# Patient Record
Sex: Male | Born: 1965 | Race: White | Hispanic: No | Marital: Married | Smoking: Former smoker
Health system: Southern US, Community
[De-identification: ages and names within clinical notes are randomized; demographics above are authoritative.]

## PROBLEM LIST (undated history)

## (undated) DIAGNOSIS — E669 Obesity, unspecified: Secondary | ICD-10-CM

## (undated) DIAGNOSIS — J449 Chronic obstructive pulmonary disease, unspecified: Secondary | ICD-10-CM

## (undated) DIAGNOSIS — E119 Type 2 diabetes mellitus without complications: Secondary | ICD-10-CM

## (undated) DIAGNOSIS — R296 Repeated falls: Secondary | ICD-10-CM

## (undated) DIAGNOSIS — F985 Adult onset fluency disorder: Secondary | ICD-10-CM

## (undated) HISTORY — PX: PARTIAL COLECTOMY: SHX5273

## (undated) HISTORY — PX: APPENDECTOMY: SHX54

## (undated) HISTORY — DX: Adult onset fluency disorder: F98.5

## (undated) HISTORY — DX: Repeated falls: R29.6

## (undated) HISTORY — PX: ANKLE FUSION: SHX881

---

## 2006-05-24 ENCOUNTER — Ambulatory Visit: Payer: Self-pay | Admitting: Gastroenterology

## 2013-05-03 ENCOUNTER — Other Ambulatory Visit (HOSPITAL_COMMUNITY): Payer: Self-pay | Admitting: Internal Medicine

## 2013-05-03 DIAGNOSIS — R918 Other nonspecific abnormal finding of lung field: Secondary | ICD-10-CM

## 2013-06-25 ENCOUNTER — Ambulatory Visit (HOSPITAL_COMMUNITY): Payer: Managed Care, Other (non HMO)

## 2014-10-17 ENCOUNTER — Encounter (HOSPITAL_COMMUNITY): Payer: Self-pay | Admitting: General Practice

## 2014-10-17 ENCOUNTER — Emergency Department (HOSPITAL_COMMUNITY)
Admission: EM | Admit: 2014-10-17 | Discharge: 2014-10-17 | Disposition: A | Discharge status: Home / Self Care | Payer: Managed Care, Other (non HMO) | Attending: Emergency Medicine | Admitting: Emergency Medicine

## 2014-10-17 ENCOUNTER — Emergency Department (HOSPITAL_COMMUNITY): Payer: Managed Care, Other (non HMO)

## 2014-10-17 DIAGNOSIS — Z87828 Personal history of other (healed) physical injury and trauma: Secondary | ICD-10-CM | POA: Diagnosis not present

## 2014-10-17 DIAGNOSIS — Z79899 Other long term (current) drug therapy: Secondary | ICD-10-CM | POA: Insufficient documentation

## 2014-10-17 DIAGNOSIS — S3992XA Unspecified injury of lower back, initial encounter: Secondary | ICD-10-CM | POA: Insufficient documentation

## 2014-10-17 DIAGNOSIS — Y999 Unspecified external cause status: Secondary | ICD-10-CM | POA: Insufficient documentation

## 2014-10-17 DIAGNOSIS — E119 Type 2 diabetes mellitus without complications: Secondary | ICD-10-CM | POA: Insufficient documentation

## 2014-10-17 DIAGNOSIS — E669 Obesity, unspecified: Secondary | ICD-10-CM | POA: Insufficient documentation

## 2014-10-17 DIAGNOSIS — S199XXA Unspecified injury of neck, initial encounter: Secondary | ICD-10-CM | POA: Diagnosis not present

## 2014-10-17 DIAGNOSIS — Y92410 Unspecified street and highway as the place of occurrence of the external cause: Secondary | ICD-10-CM | POA: Diagnosis not present

## 2014-10-17 DIAGNOSIS — M545 Low back pain, unspecified: Secondary | ICD-10-CM

## 2014-10-17 DIAGNOSIS — R202 Paresthesia of skin: Secondary | ICD-10-CM | POA: Insufficient documentation

## 2014-10-17 DIAGNOSIS — Z791 Long term (current) use of non-steroidal anti-inflammatories (NSAID): Secondary | ICD-10-CM | POA: Diagnosis not present

## 2014-10-17 DIAGNOSIS — Z720 Tobacco use: Secondary | ICD-10-CM | POA: Insufficient documentation

## 2014-10-17 DIAGNOSIS — Y9389 Activity, other specified: Secondary | ICD-10-CM | POA: Diagnosis not present

## 2014-10-17 DIAGNOSIS — M542 Cervicalgia: Secondary | ICD-10-CM

## 2014-10-17 HISTORY — DX: Type 2 diabetes mellitus without complications: E11.9

## 2014-10-17 HISTORY — DX: Obesity, unspecified: E66.9

## 2014-10-17 LAB — CBG MONITORING, ED: GLUCOSE-CAPILLARY: 278 mg/dL — AB (ref 65–99)

## 2014-10-17 MED ORDER — HYDROCODONE-ACETAMINOPHEN 5-325 MG PO TABS
2.0000 | ORAL_TABLET | Freq: Once | ORAL | Status: AC
  Administered 2014-10-17: 2 via ORAL
  Filled 2014-10-17: qty 2

## 2014-10-17 MED ORDER — HYDROMORPHONE HCL 1 MG/ML IJ SOLN: 1.0000 mg | Freq: Once | INTRAMUSCULAR | Status: DC

## 2014-10-17 MED ORDER — HYDROMORPHONE HCL 1 MG/ML IJ SOLN
1.0000 mg | Freq: Once | INTRAMUSCULAR | Status: AC
  Administered 2014-10-17: 1 mg via INTRAMUSCULAR
  Filled 2014-10-17: qty 1

## 2014-10-17 MED ORDER — CYCLOBENZAPRINE HCL 10 MG PO TABS: 10.0000 mg | ORAL_TABLET | Freq: Two times a day (BID) | ORAL | Status: AC | PRN

## 2014-10-17 MED ORDER — HYDROCODONE-ACETAMINOPHEN 5-325 MG PO TABS: 1.0000 | ORAL_TABLET | Freq: Four times a day (QID) | ORAL | Status: DC | PRN

## 2014-10-17 NOTE — ED Notes (Signed)
Pt off unit with CT 
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
## 2014-10-17 NOTE — ED Notes (Signed)
Pt off unit with MRI 
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
## 2014-10-17 NOTE — Discharge Instructions (Signed)
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
## 2014-10-17 NOTE — ED Notes (Signed)
Pt cbg 278

## 2014-10-17 NOTE — ED Notes (Signed)
Pt returned from CT °
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
## 2018-09-13 DIAGNOSIS — E782 Mixed hyperlipidemia: Secondary | ICD-10-CM | POA: Diagnosis not present

## 2018-09-13 DIAGNOSIS — G4700 Insomnia, unspecified: Secondary | ICD-10-CM | POA: Diagnosis not present

## 2018-09-13 DIAGNOSIS — G894 Chronic pain syndrome: Secondary | ICD-10-CM | POA: Diagnosis not present

## 2018-09-13 DIAGNOSIS — I10 Essential (primary) hypertension: Secondary | ICD-10-CM | POA: Diagnosis not present

## 2018-09-13 DIAGNOSIS — E1165 Type 2 diabetes mellitus with hyperglycemia: Secondary | ICD-10-CM | POA: Diagnosis not present

## 2018-10-19 DIAGNOSIS — M47812 Spondylosis without myelopathy or radiculopathy, cervical region: Secondary | ICD-10-CM | POA: Diagnosis not present

## 2018-10-23 DIAGNOSIS — G894 Chronic pain syndrome: Secondary | ICD-10-CM | POA: Diagnosis not present

## 2018-10-23 DIAGNOSIS — I10 Essential (primary) hypertension: Secondary | ICD-10-CM | POA: Diagnosis not present

## 2018-10-23 DIAGNOSIS — E1165 Type 2 diabetes mellitus with hyperglycemia: Secondary | ICD-10-CM | POA: Diagnosis not present

## 2018-10-24 DIAGNOSIS — E1165 Type 2 diabetes mellitus with hyperglycemia: Secondary | ICD-10-CM | POA: Diagnosis not present

## 2018-10-24 DIAGNOSIS — G4700 Insomnia, unspecified: Secondary | ICD-10-CM | POA: Diagnosis not present

## 2018-10-24 DIAGNOSIS — I10 Essential (primary) hypertension: Secondary | ICD-10-CM | POA: Diagnosis not present

## 2018-10-24 DIAGNOSIS — G894 Chronic pain syndrome: Secondary | ICD-10-CM | POA: Diagnosis not present

## 2018-10-24 DIAGNOSIS — E782 Mixed hyperlipidemia: Secondary | ICD-10-CM | POA: Diagnosis not present

## 2018-11-01 DIAGNOSIS — E782 Mixed hyperlipidemia: Secondary | ICD-10-CM | POA: Diagnosis not present

## 2018-11-01 DIAGNOSIS — I10 Essential (primary) hypertension: Secondary | ICD-10-CM | POA: Diagnosis not present

## 2018-11-01 DIAGNOSIS — E1165 Type 2 diabetes mellitus with hyperglycemia: Secondary | ICD-10-CM | POA: Diagnosis not present

## 2018-11-01 DIAGNOSIS — G894 Chronic pain syndrome: Secondary | ICD-10-CM | POA: Diagnosis not present

## 2018-11-01 DIAGNOSIS — G4700 Insomnia, unspecified: Secondary | ICD-10-CM | POA: Diagnosis not present

## 2018-11-15 DIAGNOSIS — M47812 Spondylosis without myelopathy or radiculopathy, cervical region: Secondary | ICD-10-CM | POA: Diagnosis not present

## 2018-12-13 DIAGNOSIS — I10 Essential (primary) hypertension: Secondary | ICD-10-CM | POA: Diagnosis not present

## 2018-12-13 DIAGNOSIS — E782 Mixed hyperlipidemia: Secondary | ICD-10-CM | POA: Diagnosis not present

## 2018-12-13 DIAGNOSIS — E1165 Type 2 diabetes mellitus with hyperglycemia: Secondary | ICD-10-CM | POA: Diagnosis not present

## 2018-12-13 DIAGNOSIS — G894 Chronic pain syndrome: Secondary | ICD-10-CM | POA: Diagnosis not present

## 2018-12-13 DIAGNOSIS — G4700 Insomnia, unspecified: Secondary | ICD-10-CM | POA: Diagnosis not present

## 2019-01-03 DIAGNOSIS — Z79899 Other long term (current) drug therapy: Secondary | ICD-10-CM | POA: Diagnosis not present

## 2019-01-03 DIAGNOSIS — Z7984 Long term (current) use of oral hypoglycemic drugs: Secondary | ICD-10-CM | POA: Diagnosis not present

## 2019-01-03 DIAGNOSIS — E782 Mixed hyperlipidemia: Secondary | ICD-10-CM | POA: Diagnosis not present

## 2019-01-03 DIAGNOSIS — J449 Chronic obstructive pulmonary disease, unspecified: Secondary | ICD-10-CM | POA: Diagnosis not present

## 2019-01-03 DIAGNOSIS — E1165 Type 2 diabetes mellitus with hyperglycemia: Secondary | ICD-10-CM | POA: Diagnosis not present

## 2019-01-03 DIAGNOSIS — M47812 Spondylosis without myelopathy or radiculopathy, cervical region: Secondary | ICD-10-CM | POA: Diagnosis not present

## 2019-01-03 DIAGNOSIS — I10 Essential (primary) hypertension: Secondary | ICD-10-CM | POA: Diagnosis not present

## 2019-01-03 DIAGNOSIS — G4700 Insomnia, unspecified: Secondary | ICD-10-CM | POA: Diagnosis not present

## 2019-01-03 DIAGNOSIS — E119 Type 2 diabetes mellitus without complications: Secondary | ICD-10-CM | POA: Diagnosis not present

## 2019-01-03 DIAGNOSIS — G894 Chronic pain syndrome: Secondary | ICD-10-CM | POA: Diagnosis not present

## 2019-01-03 DIAGNOSIS — Z87891 Personal history of nicotine dependence: Secondary | ICD-10-CM | POA: Diagnosis not present

## 2019-01-18 DIAGNOSIS — E1165 Type 2 diabetes mellitus with hyperglycemia: Secondary | ICD-10-CM | POA: Diagnosis not present

## 2019-01-18 DIAGNOSIS — E782 Mixed hyperlipidemia: Secondary | ICD-10-CM | POA: Diagnosis not present

## 2019-01-18 DIAGNOSIS — E1140 Type 2 diabetes mellitus with diabetic neuropathy, unspecified: Secondary | ICD-10-CM | POA: Diagnosis not present

## 2019-01-18 DIAGNOSIS — I10 Essential (primary) hypertension: Secondary | ICD-10-CM | POA: Diagnosis not present

## 2019-01-23 DIAGNOSIS — I10 Essential (primary) hypertension: Secondary | ICD-10-CM | POA: Diagnosis not present

## 2019-01-23 DIAGNOSIS — E782 Mixed hyperlipidemia: Secondary | ICD-10-CM | POA: Diagnosis not present

## 2019-01-23 DIAGNOSIS — E1165 Type 2 diabetes mellitus with hyperglycemia: Secondary | ICD-10-CM | POA: Diagnosis not present

## 2019-01-23 DIAGNOSIS — F0781 Postconcussional syndrome: Secondary | ICD-10-CM | POA: Diagnosis not present

## 2019-01-23 DIAGNOSIS — G894 Chronic pain syndrome: Secondary | ICD-10-CM | POA: Diagnosis not present

## 2019-01-23 DIAGNOSIS — M5481 Occipital neuralgia: Secondary | ICD-10-CM | POA: Diagnosis not present

## 2019-01-23 DIAGNOSIS — G4700 Insomnia, unspecified: Secondary | ICD-10-CM | POA: Diagnosis not present

## 2019-01-23 DIAGNOSIS — Z6837 Body mass index (BMI) 37.0-37.9, adult: Secondary | ICD-10-CM | POA: Diagnosis not present

## 2019-01-23 DIAGNOSIS — M5412 Radiculopathy, cervical region: Secondary | ICD-10-CM | POA: Diagnosis not present

## 2019-02-01 DIAGNOSIS — G8929 Other chronic pain: Secondary | ICD-10-CM | POA: Diagnosis not present

## 2019-02-01 DIAGNOSIS — G4700 Insomnia, unspecified: Secondary | ICD-10-CM | POA: Diagnosis not present

## 2019-02-01 DIAGNOSIS — E782 Mixed hyperlipidemia: Secondary | ICD-10-CM | POA: Diagnosis not present

## 2019-02-01 DIAGNOSIS — M5412 Radiculopathy, cervical region: Secondary | ICD-10-CM | POA: Diagnosis not present

## 2019-02-01 DIAGNOSIS — E1140 Type 2 diabetes mellitus with diabetic neuropathy, unspecified: Secondary | ICD-10-CM | POA: Diagnosis not present

## 2019-02-01 DIAGNOSIS — I10 Essential (primary) hypertension: Secondary | ICD-10-CM | POA: Diagnosis not present

## 2019-02-01 DIAGNOSIS — Z5181 Encounter for therapeutic drug level monitoring: Secondary | ICD-10-CM | POA: Diagnosis not present

## 2019-04-02 DIAGNOSIS — G894 Chronic pain syndrome: Secondary | ICD-10-CM | POA: Diagnosis not present

## 2019-04-02 DIAGNOSIS — G4700 Insomnia, unspecified: Secondary | ICD-10-CM | POA: Diagnosis not present

## 2019-04-02 DIAGNOSIS — I10 Essential (primary) hypertension: Secondary | ICD-10-CM | POA: Diagnosis not present

## 2019-04-02 DIAGNOSIS — E782 Mixed hyperlipidemia: Secondary | ICD-10-CM | POA: Diagnosis not present

## 2019-04-02 DIAGNOSIS — E1165 Type 2 diabetes mellitus with hyperglycemia: Secondary | ICD-10-CM | POA: Diagnosis not present

## 2019-05-07 DIAGNOSIS — G894 Chronic pain syndrome: Secondary | ICD-10-CM | POA: Diagnosis not present

## 2019-05-07 DIAGNOSIS — E1165 Type 2 diabetes mellitus with hyperglycemia: Secondary | ICD-10-CM | POA: Diagnosis not present

## 2019-05-07 DIAGNOSIS — E782 Mixed hyperlipidemia: Secondary | ICD-10-CM | POA: Diagnosis not present

## 2019-05-07 DIAGNOSIS — G4700 Insomnia, unspecified: Secondary | ICD-10-CM | POA: Diagnosis not present

## 2019-05-07 DIAGNOSIS — I10 Essential (primary) hypertension: Secondary | ICD-10-CM | POA: Diagnosis not present

## 2019-05-23 DIAGNOSIS — G8929 Other chronic pain: Secondary | ICD-10-CM | POA: Diagnosis not present

## 2019-05-23 DIAGNOSIS — M5412 Radiculopathy, cervical region: Secondary | ICD-10-CM | POA: Diagnosis not present

## 2019-05-23 DIAGNOSIS — Z0001 Encounter for general adult medical examination with abnormal findings: Secondary | ICD-10-CM | POA: Diagnosis not present

## 2019-05-23 DIAGNOSIS — E782 Mixed hyperlipidemia: Secondary | ICD-10-CM | POA: Diagnosis not present

## 2019-05-23 DIAGNOSIS — I10 Essential (primary) hypertension: Secondary | ICD-10-CM | POA: Diagnosis not present

## 2019-05-23 DIAGNOSIS — Z5181 Encounter for therapeutic drug level monitoring: Secondary | ICD-10-CM | POA: Diagnosis not present

## 2019-05-23 DIAGNOSIS — E1140 Type 2 diabetes mellitus with diabetic neuropathy, unspecified: Secondary | ICD-10-CM | POA: Diagnosis not present

## 2019-05-23 DIAGNOSIS — G4700 Insomnia, unspecified: Secondary | ICD-10-CM | POA: Diagnosis not present

## 2019-05-28 DIAGNOSIS — G894 Chronic pain syndrome: Secondary | ICD-10-CM | POA: Diagnosis not present

## 2019-05-28 DIAGNOSIS — M5412 Radiculopathy, cervical region: Secondary | ICD-10-CM | POA: Diagnosis not present

## 2019-05-28 DIAGNOSIS — G4700 Insomnia, unspecified: Secondary | ICD-10-CM | POA: Diagnosis not present

## 2019-05-28 DIAGNOSIS — E1165 Type 2 diabetes mellitus with hyperglycemia: Secondary | ICD-10-CM | POA: Diagnosis not present

## 2019-05-28 DIAGNOSIS — E782 Mixed hyperlipidemia: Secondary | ICD-10-CM | POA: Diagnosis not present

## 2019-05-28 DIAGNOSIS — F0781 Postconcussional syndrome: Secondary | ICD-10-CM | POA: Diagnosis not present

## 2019-05-28 DIAGNOSIS — M5481 Occipital neuralgia: Secondary | ICD-10-CM | POA: Diagnosis not present

## 2019-05-28 DIAGNOSIS — I10 Essential (primary) hypertension: Secondary | ICD-10-CM | POA: Diagnosis not present

## 2019-06-04 DIAGNOSIS — I10 Essential (primary) hypertension: Secondary | ICD-10-CM | POA: Diagnosis not present

## 2019-06-04 DIAGNOSIS — E1165 Type 2 diabetes mellitus with hyperglycemia: Secondary | ICD-10-CM | POA: Diagnosis not present

## 2019-06-04 DIAGNOSIS — G894 Chronic pain syndrome: Secondary | ICD-10-CM | POA: Diagnosis not present

## 2019-06-04 DIAGNOSIS — E782 Mixed hyperlipidemia: Secondary | ICD-10-CM | POA: Diagnosis not present

## 2019-06-04 DIAGNOSIS — G4700 Insomnia, unspecified: Secondary | ICD-10-CM | POA: Diagnosis not present

## 2019-07-06 DIAGNOSIS — G4700 Insomnia, unspecified: Secondary | ICD-10-CM | POA: Diagnosis not present

## 2019-07-06 DIAGNOSIS — G894 Chronic pain syndrome: Secondary | ICD-10-CM | POA: Diagnosis not present

## 2019-07-06 DIAGNOSIS — I10 Essential (primary) hypertension: Secondary | ICD-10-CM | POA: Diagnosis not present

## 2019-07-06 DIAGNOSIS — E1165 Type 2 diabetes mellitus with hyperglycemia: Secondary | ICD-10-CM | POA: Diagnosis not present

## 2019-07-06 DIAGNOSIS — E782 Mixed hyperlipidemia: Secondary | ICD-10-CM | POA: Diagnosis not present

## 2019-07-16 DIAGNOSIS — M5432 Sciatica, left side: Secondary | ICD-10-CM | POA: Diagnosis not present

## 2019-07-30 DIAGNOSIS — E782 Mixed hyperlipidemia: Secondary | ICD-10-CM | POA: Diagnosis not present

## 2019-07-30 DIAGNOSIS — G894 Chronic pain syndrome: Secondary | ICD-10-CM | POA: Diagnosis not present

## 2019-07-30 DIAGNOSIS — I10 Essential (primary) hypertension: Secondary | ICD-10-CM | POA: Diagnosis not present

## 2019-07-30 DIAGNOSIS — E1165 Type 2 diabetes mellitus with hyperglycemia: Secondary | ICD-10-CM | POA: Diagnosis not present

## 2019-07-30 DIAGNOSIS — G4700 Insomnia, unspecified: Secondary | ICD-10-CM | POA: Diagnosis not present

## 2019-08-29 DIAGNOSIS — G894 Chronic pain syndrome: Secondary | ICD-10-CM | POA: Diagnosis not present

## 2019-08-29 DIAGNOSIS — G4700 Insomnia, unspecified: Secondary | ICD-10-CM | POA: Diagnosis not present

## 2019-08-29 DIAGNOSIS — E782 Mixed hyperlipidemia: Secondary | ICD-10-CM | POA: Diagnosis not present

## 2019-08-29 DIAGNOSIS — E1165 Type 2 diabetes mellitus with hyperglycemia: Secondary | ICD-10-CM | POA: Diagnosis not present

## 2019-08-29 DIAGNOSIS — I10 Essential (primary) hypertension: Secondary | ICD-10-CM | POA: Diagnosis not present

## 2019-10-26 DIAGNOSIS — G894 Chronic pain syndrome: Secondary | ICD-10-CM | POA: Diagnosis not present

## 2019-10-26 DIAGNOSIS — E782 Mixed hyperlipidemia: Secondary | ICD-10-CM | POA: Diagnosis not present

## 2019-10-26 DIAGNOSIS — I10 Essential (primary) hypertension: Secondary | ICD-10-CM | POA: Diagnosis not present

## 2019-10-26 DIAGNOSIS — E1165 Type 2 diabetes mellitus with hyperglycemia: Secondary | ICD-10-CM | POA: Diagnosis not present

## 2019-10-26 DIAGNOSIS — G4700 Insomnia, unspecified: Secondary | ICD-10-CM | POA: Diagnosis not present

## 2019-10-29 DIAGNOSIS — E1165 Type 2 diabetes mellitus with hyperglycemia: Secondary | ICD-10-CM | POA: Diagnosis not present

## 2019-10-29 DIAGNOSIS — E7849 Other hyperlipidemia: Secondary | ICD-10-CM | POA: Diagnosis not present

## 2019-10-29 DIAGNOSIS — G894 Chronic pain syndrome: Secondary | ICD-10-CM | POA: Diagnosis not present

## 2019-10-29 DIAGNOSIS — I10 Essential (primary) hypertension: Secondary | ICD-10-CM | POA: Diagnosis not present

## 2019-11-29 DIAGNOSIS — E1140 Type 2 diabetes mellitus with diabetic neuropathy, unspecified: Secondary | ICD-10-CM | POA: Diagnosis not present

## 2019-11-29 DIAGNOSIS — Z5181 Encounter for therapeutic drug level monitoring: Secondary | ICD-10-CM | POA: Diagnosis not present

## 2019-11-29 DIAGNOSIS — G8929 Other chronic pain: Secondary | ICD-10-CM | POA: Diagnosis not present

## 2019-11-29 DIAGNOSIS — I10 Essential (primary) hypertension: Secondary | ICD-10-CM | POA: Diagnosis not present

## 2019-11-29 DIAGNOSIS — E782 Mixed hyperlipidemia: Secondary | ICD-10-CM | POA: Diagnosis not present

## 2019-11-29 DIAGNOSIS — Z0001 Encounter for general adult medical examination with abnormal findings: Secondary | ICD-10-CM | POA: Diagnosis not present

## 2019-11-29 DIAGNOSIS — M5412 Radiculopathy, cervical region: Secondary | ICD-10-CM | POA: Diagnosis not present

## 2019-11-29 DIAGNOSIS — G4700 Insomnia, unspecified: Secondary | ICD-10-CM | POA: Diagnosis not present

## 2019-12-05 DIAGNOSIS — M5412 Radiculopathy, cervical region: Secondary | ICD-10-CM | POA: Diagnosis not present

## 2019-12-05 DIAGNOSIS — M5481 Occipital neuralgia: Secondary | ICD-10-CM | POA: Diagnosis not present

## 2019-12-05 DIAGNOSIS — Z0001 Encounter for general adult medical examination with abnormal findings: Secondary | ICD-10-CM | POA: Diagnosis not present

## 2019-12-05 DIAGNOSIS — E1165 Type 2 diabetes mellitus with hyperglycemia: Secondary | ICD-10-CM | POA: Diagnosis not present

## 2019-12-05 DIAGNOSIS — G894 Chronic pain syndrome: Secondary | ICD-10-CM | POA: Diagnosis not present

## 2019-12-05 DIAGNOSIS — E782 Mixed hyperlipidemia: Secondary | ICD-10-CM | POA: Diagnosis not present

## 2019-12-05 DIAGNOSIS — G4700 Insomnia, unspecified: Secondary | ICD-10-CM | POA: Diagnosis not present

## 2019-12-05 DIAGNOSIS — I10 Essential (primary) hypertension: Secondary | ICD-10-CM | POA: Diagnosis not present

## 2019-12-05 DIAGNOSIS — F0781 Postconcussional syndrome: Secondary | ICD-10-CM | POA: Diagnosis not present

## 2020-01-01 DIAGNOSIS — E1165 Type 2 diabetes mellitus with hyperglycemia: Secondary | ICD-10-CM | POA: Diagnosis not present

## 2020-01-01 DIAGNOSIS — I10 Essential (primary) hypertension: Secondary | ICD-10-CM | POA: Diagnosis not present

## 2020-01-01 DIAGNOSIS — G4700 Insomnia, unspecified: Secondary | ICD-10-CM | POA: Diagnosis not present

## 2020-01-01 DIAGNOSIS — E782 Mixed hyperlipidemia: Secondary | ICD-10-CM | POA: Diagnosis not present

## 2020-01-01 DIAGNOSIS — G894 Chronic pain syndrome: Secondary | ICD-10-CM | POA: Diagnosis not present

## 2020-02-04 DIAGNOSIS — E782 Mixed hyperlipidemia: Secondary | ICD-10-CM | POA: Diagnosis not present

## 2020-02-04 DIAGNOSIS — G4700 Insomnia, unspecified: Secondary | ICD-10-CM | POA: Diagnosis not present

## 2020-02-04 DIAGNOSIS — I10 Essential (primary) hypertension: Secondary | ICD-10-CM | POA: Diagnosis not present

## 2020-02-04 DIAGNOSIS — E1165 Type 2 diabetes mellitus with hyperglycemia: Secondary | ICD-10-CM | POA: Diagnosis not present

## 2020-02-04 DIAGNOSIS — G894 Chronic pain syndrome: Secondary | ICD-10-CM | POA: Diagnosis not present

## 2020-02-14 DIAGNOSIS — K432 Incisional hernia without obstruction or gangrene: Secondary | ICD-10-CM | POA: Diagnosis not present

## 2020-02-14 DIAGNOSIS — Z6841 Body Mass Index (BMI) 40.0 and over, adult: Secondary | ICD-10-CM | POA: Diagnosis not present

## 2020-02-14 DIAGNOSIS — E119 Type 2 diabetes mellitus without complications: Secondary | ICD-10-CM | POA: Diagnosis not present

## 2020-03-20 DIAGNOSIS — E119 Type 2 diabetes mellitus without complications: Secondary | ICD-10-CM | POA: Diagnosis not present

## 2020-03-28 DIAGNOSIS — G894 Chronic pain syndrome: Secondary | ICD-10-CM | POA: Diagnosis not present

## 2020-03-28 DIAGNOSIS — I10 Essential (primary) hypertension: Secondary | ICD-10-CM | POA: Diagnosis not present

## 2020-03-28 DIAGNOSIS — E1165 Type 2 diabetes mellitus with hyperglycemia: Secondary | ICD-10-CM | POA: Diagnosis not present

## 2020-03-28 DIAGNOSIS — E782 Mixed hyperlipidemia: Secondary | ICD-10-CM | POA: Diagnosis not present

## 2020-03-28 DIAGNOSIS — G4700 Insomnia, unspecified: Secondary | ICD-10-CM | POA: Diagnosis not present

## 2020-04-04 DIAGNOSIS — J069 Acute upper respiratory infection, unspecified: Secondary | ICD-10-CM | POA: Diagnosis not present

## 2020-04-26 DIAGNOSIS — G4700 Insomnia, unspecified: Secondary | ICD-10-CM | POA: Diagnosis not present

## 2020-04-26 DIAGNOSIS — E1165 Type 2 diabetes mellitus with hyperglycemia: Secondary | ICD-10-CM | POA: Diagnosis not present

## 2020-04-26 DIAGNOSIS — I10 Essential (primary) hypertension: Secondary | ICD-10-CM | POA: Diagnosis not present

## 2020-04-26 DIAGNOSIS — G894 Chronic pain syndrome: Secondary | ICD-10-CM | POA: Diagnosis not present

## 2020-04-26 DIAGNOSIS — E782 Mixed hyperlipidemia: Secondary | ICD-10-CM | POA: Diagnosis not present

## 2020-05-17 DIAGNOSIS — B370 Candidal stomatitis: Secondary | ICD-10-CM | POA: Diagnosis not present

## 2020-05-26 DIAGNOSIS — I10 Essential (primary) hypertension: Secondary | ICD-10-CM | POA: Diagnosis not present

## 2020-05-26 DIAGNOSIS — E1165 Type 2 diabetes mellitus with hyperglycemia: Secondary | ICD-10-CM | POA: Diagnosis not present

## 2020-06-17 DIAGNOSIS — E559 Vitamin D deficiency, unspecified: Secondary | ICD-10-CM | POA: Diagnosis not present

## 2020-06-17 DIAGNOSIS — Z6841 Body Mass Index (BMI) 40.0 and over, adult: Secondary | ICD-10-CM | POA: Diagnosis not present

## 2020-06-17 DIAGNOSIS — E1140 Type 2 diabetes mellitus with diabetic neuropathy, unspecified: Secondary | ICD-10-CM | POA: Diagnosis not present

## 2020-06-17 DIAGNOSIS — E7849 Other hyperlipidemia: Secondary | ICD-10-CM | POA: Diagnosis not present

## 2020-06-17 DIAGNOSIS — Z0001 Encounter for general adult medical examination with abnormal findings: Secondary | ICD-10-CM | POA: Diagnosis not present

## 2020-06-17 DIAGNOSIS — E1165 Type 2 diabetes mellitus with hyperglycemia: Secondary | ICD-10-CM | POA: Diagnosis not present

## 2020-06-17 DIAGNOSIS — I10 Essential (primary) hypertension: Secondary | ICD-10-CM | POA: Diagnosis not present

## 2020-06-17 DIAGNOSIS — E782 Mixed hyperlipidemia: Secondary | ICD-10-CM | POA: Diagnosis not present

## 2020-06-25 DIAGNOSIS — J069 Acute upper respiratory infection, unspecified: Secondary | ICD-10-CM | POA: Diagnosis not present

## 2020-06-25 DIAGNOSIS — I10 Essential (primary) hypertension: Secondary | ICD-10-CM | POA: Diagnosis not present

## 2020-06-25 DIAGNOSIS — E1165 Type 2 diabetes mellitus with hyperglycemia: Secondary | ICD-10-CM | POA: Diagnosis not present

## 2020-07-27 DIAGNOSIS — E1165 Type 2 diabetes mellitus with hyperglycemia: Secondary | ICD-10-CM | POA: Diagnosis not present

## 2020-07-27 DIAGNOSIS — I10 Essential (primary) hypertension: Secondary | ICD-10-CM | POA: Diagnosis not present

## 2020-08-26 DIAGNOSIS — E1165 Type 2 diabetes mellitus with hyperglycemia: Secondary | ICD-10-CM | POA: Diagnosis not present

## 2020-08-26 DIAGNOSIS — I10 Essential (primary) hypertension: Secondary | ICD-10-CM | POA: Diagnosis not present

## 2020-09-06 DIAGNOSIS — R410 Disorientation, unspecified: Secondary | ICD-10-CM | POA: Diagnosis not present

## 2020-09-06 DIAGNOSIS — R42 Dizziness and giddiness: Secondary | ICD-10-CM | POA: Diagnosis not present

## 2020-09-06 DIAGNOSIS — R059 Cough, unspecified: Secondary | ICD-10-CM | POA: Diagnosis not present

## 2020-09-06 DIAGNOSIS — R9431 Abnormal electrocardiogram [ECG] [EKG]: Secondary | ICD-10-CM | POA: Diagnosis not present

## 2020-09-06 DIAGNOSIS — R55 Syncope and collapse: Secondary | ICD-10-CM | POA: Diagnosis not present

## 2020-09-06 DIAGNOSIS — R4781 Slurred speech: Secondary | ICD-10-CM | POA: Diagnosis not present

## 2020-09-06 DIAGNOSIS — R4182 Altered mental status, unspecified: Secondary | ICD-10-CM | POA: Diagnosis not present

## 2020-09-06 DIAGNOSIS — E860 Dehydration: Secondary | ICD-10-CM | POA: Diagnosis not present

## 2020-09-06 DIAGNOSIS — E119 Type 2 diabetes mellitus without complications: Secondary | ICD-10-CM | POA: Diagnosis not present

## 2020-09-06 DIAGNOSIS — I517 Cardiomegaly: Secondary | ICD-10-CM | POA: Diagnosis not present

## 2020-09-06 DIAGNOSIS — J811 Chronic pulmonary edema: Secondary | ICD-10-CM | POA: Diagnosis not present

## 2020-09-09 ENCOUNTER — Other Ambulatory Visit (HOSPITAL_COMMUNITY): Payer: Self-pay | Admitting: Family Medicine

## 2020-09-09 DIAGNOSIS — F8081 Childhood onset fluency disorder: Secondary | ICD-10-CM | POA: Diagnosis not present

## 2020-09-09 DIAGNOSIS — M25562 Pain in left knee: Secondary | ICD-10-CM

## 2020-09-09 DIAGNOSIS — R296 Repeated falls: Secondary | ICD-10-CM | POA: Diagnosis not present

## 2020-09-11 ENCOUNTER — Ambulatory Visit (HOSPITAL_COMMUNITY)
Admission: RE | Admit: 2020-09-11 | Discharge: 2020-09-11 | Disposition: A | Discharge status: Home / Self Care | Payer: Medicare HMO | Source: Ambulatory Visit | Attending: Family Medicine | Admitting: Family Medicine

## 2020-09-11 DIAGNOSIS — M25562 Pain in left knee: Secondary | ICD-10-CM | POA: Insufficient documentation

## 2020-09-11 DIAGNOSIS — I10 Essential (primary) hypertension: Secondary | ICD-10-CM | POA: Diagnosis not present

## 2020-09-11 DIAGNOSIS — E119 Type 2 diabetes mellitus without complications: Secondary | ICD-10-CM | POA: Diagnosis not present

## 2020-09-11 DIAGNOSIS — M25462 Effusion, left knee: Secondary | ICD-10-CM | POA: Diagnosis not present

## 2020-09-11 DIAGNOSIS — E782 Mixed hyperlipidemia: Secondary | ICD-10-CM | POA: Diagnosis not present

## 2020-09-11 DIAGNOSIS — M1712 Unilateral primary osteoarthritis, left knee: Secondary | ICD-10-CM | POA: Diagnosis not present

## 2020-09-11 DIAGNOSIS — E559 Vitamin D deficiency, unspecified: Secondary | ICD-10-CM | POA: Diagnosis not present

## 2020-09-18 DIAGNOSIS — E782 Mixed hyperlipidemia: Secondary | ICD-10-CM | POA: Diagnosis not present

## 2020-09-18 DIAGNOSIS — E119 Type 2 diabetes mellitus without complications: Secondary | ICD-10-CM | POA: Diagnosis not present

## 2020-09-18 DIAGNOSIS — I10 Essential (primary) hypertension: Secondary | ICD-10-CM | POA: Diagnosis not present

## 2020-09-18 DIAGNOSIS — E559 Vitamin D deficiency, unspecified: Secondary | ICD-10-CM | POA: Diagnosis not present

## 2020-09-18 DIAGNOSIS — E785 Hyperlipidemia, unspecified: Secondary | ICD-10-CM | POA: Diagnosis not present

## 2020-09-25 DIAGNOSIS — I10 Essential (primary) hypertension: Secondary | ICD-10-CM | POA: Diagnosis not present

## 2020-09-25 DIAGNOSIS — E1165 Type 2 diabetes mellitus with hyperglycemia: Secondary | ICD-10-CM | POA: Diagnosis not present

## 2020-10-09 DIAGNOSIS — I10 Essential (primary) hypertension: Secondary | ICD-10-CM | POA: Diagnosis not present

## 2020-10-09 DIAGNOSIS — E785 Hyperlipidemia, unspecified: Secondary | ICD-10-CM | POA: Diagnosis not present

## 2020-10-09 DIAGNOSIS — E119 Type 2 diabetes mellitus without complications: Secondary | ICD-10-CM | POA: Diagnosis not present

## 2020-10-26 DIAGNOSIS — I10 Essential (primary) hypertension: Secondary | ICD-10-CM | POA: Diagnosis not present

## 2020-10-26 DIAGNOSIS — E1165 Type 2 diabetes mellitus with hyperglycemia: Secondary | ICD-10-CM | POA: Diagnosis not present

## 2020-10-30 DIAGNOSIS — L282 Other prurigo: Secondary | ICD-10-CM | POA: Diagnosis not present

## 2020-11-25 ENCOUNTER — Encounter: Payer: Self-pay | Admitting: *Deleted

## 2020-11-25 ENCOUNTER — Other Ambulatory Visit: Payer: Self-pay | Admitting: *Deleted

## 2020-11-26 DIAGNOSIS — I10 Essential (primary) hypertension: Secondary | ICD-10-CM | POA: Diagnosis not present

## 2020-11-26 DIAGNOSIS — E1165 Type 2 diabetes mellitus with hyperglycemia: Secondary | ICD-10-CM | POA: Diagnosis not present

## 2020-11-28 ENCOUNTER — Ambulatory Visit: Payer: Medicare HMO | Admitting: Diagnostic Neuroimaging

## 2020-12-12 DIAGNOSIS — I10 Essential (primary) hypertension: Secondary | ICD-10-CM | POA: Diagnosis not present

## 2020-12-12 DIAGNOSIS — E785 Hyperlipidemia, unspecified: Secondary | ICD-10-CM | POA: Diagnosis not present

## 2020-12-12 DIAGNOSIS — E559 Vitamin D deficiency, unspecified: Secondary | ICD-10-CM | POA: Diagnosis not present

## 2020-12-12 DIAGNOSIS — E119 Type 2 diabetes mellitus without complications: Secondary | ICD-10-CM | POA: Diagnosis not present

## 2020-12-19 DIAGNOSIS — E785 Hyperlipidemia, unspecified: Secondary | ICD-10-CM | POA: Diagnosis not present

## 2020-12-19 DIAGNOSIS — E782 Mixed hyperlipidemia: Secondary | ICD-10-CM | POA: Diagnosis not present

## 2020-12-19 DIAGNOSIS — E119 Type 2 diabetes mellitus without complications: Secondary | ICD-10-CM | POA: Diagnosis not present

## 2020-12-19 DIAGNOSIS — G8929 Other chronic pain: Secondary | ICD-10-CM | POA: Diagnosis not present

## 2020-12-19 DIAGNOSIS — G4700 Insomnia, unspecified: Secondary | ICD-10-CM | POA: Diagnosis not present

## 2020-12-19 DIAGNOSIS — E559 Vitamin D deficiency, unspecified: Secondary | ICD-10-CM | POA: Diagnosis not present

## 2020-12-19 DIAGNOSIS — I10 Essential (primary) hypertension: Secondary | ICD-10-CM | POA: Diagnosis not present

## 2020-12-26 DIAGNOSIS — E1165 Type 2 diabetes mellitus with hyperglycemia: Secondary | ICD-10-CM | POA: Diagnosis not present

## 2020-12-26 DIAGNOSIS — I10 Essential (primary) hypertension: Secondary | ICD-10-CM | POA: Diagnosis not present

## 2021-01-26 DIAGNOSIS — I10 Essential (primary) hypertension: Secondary | ICD-10-CM | POA: Diagnosis not present

## 2021-01-26 DIAGNOSIS — E1165 Type 2 diabetes mellitus with hyperglycemia: Secondary | ICD-10-CM | POA: Diagnosis not present

## 2021-02-25 DIAGNOSIS — I10 Essential (primary) hypertension: Secondary | ICD-10-CM | POA: Diagnosis not present

## 2021-02-25 DIAGNOSIS — E1165 Type 2 diabetes mellitus with hyperglycemia: Secondary | ICD-10-CM | POA: Diagnosis not present

## 2021-02-28 DIAGNOSIS — Z87891 Personal history of nicotine dependence: Secondary | ICD-10-CM | POA: Diagnosis not present

## 2021-02-28 DIAGNOSIS — R600 Localized edema: Secondary | ICD-10-CM | POA: Diagnosis not present

## 2021-02-28 DIAGNOSIS — S8002XA Contusion of left knee, initial encounter: Secondary | ICD-10-CM | POA: Diagnosis not present

## 2021-02-28 DIAGNOSIS — E119 Type 2 diabetes mellitus without complications: Secondary | ICD-10-CM | POA: Diagnosis not present

## 2021-02-28 DIAGNOSIS — W1839XA Other fall on same level, initial encounter: Secondary | ICD-10-CM | POA: Diagnosis not present

## 2021-02-28 DIAGNOSIS — M25462 Effusion, left knee: Secondary | ICD-10-CM | POA: Diagnosis not present

## 2021-02-28 DIAGNOSIS — M1712 Unilateral primary osteoarthritis, left knee: Secondary | ICD-10-CM | POA: Diagnosis not present

## 2021-02-28 DIAGNOSIS — M5481 Occipital neuralgia: Secondary | ICD-10-CM | POA: Diagnosis not present

## 2021-03-03 DIAGNOSIS — M25562 Pain in left knee: Secondary | ICD-10-CM | POA: Diagnosis not present

## 2021-03-27 DIAGNOSIS — E1165 Type 2 diabetes mellitus with hyperglycemia: Secondary | ICD-10-CM | POA: Diagnosis not present

## 2021-03-27 DIAGNOSIS — I10 Essential (primary) hypertension: Secondary | ICD-10-CM | POA: Diagnosis not present

## 2021-04-01 ENCOUNTER — Encounter: Payer: Self-pay | Admitting: Orthopaedic Surgery

## 2021-04-01 ENCOUNTER — Ambulatory Visit (INDEPENDENT_AMBULATORY_CARE_PROVIDER_SITE_OTHER): Payer: Medicare HMO | Admitting: Orthopaedic Surgery

## 2021-04-01 ENCOUNTER — Other Ambulatory Visit: Payer: Self-pay

## 2021-04-01 VITALS — Ht 70.0 in | Wt 269.4 lb

## 2021-04-01 DIAGNOSIS — G8929 Other chronic pain: Secondary | ICD-10-CM | POA: Diagnosis not present

## 2021-04-01 DIAGNOSIS — M25562 Pain in left knee: Secondary | ICD-10-CM

## 2021-04-01 NOTE — Addendum Note (Signed)
Addended by: Derek Mound A on: 04/01/2021 02:35 PM   Modules accepted: Orders

## 2021-04-01 NOTE — Progress Notes (Signed)
Subjective:    Patient ID: Tyrone Peterson, male    DOB: December 09, 1965, 56 y.o.   MRN: [id number]  HPI He has history of left knee pain.  He had a fall and had X-rays done in June 2022 at [facility].  He had a fall about a month ago and hurt his left knee again.  [facility].  He has tricompartmental degenerative changes.  I have reviewed both reports and ER notes.  I have independently reviewed and interpreted x-rays of this patient done at another site by another physician or qualified health professional.  He has had more pain after this last fall.  He has persistent swelling of the left knee, giving way, popping.  He cannot fully extend the knee.  He has to use a cane to walk.  Even then he has a limp.  He has occipital neurologia and that also causes him to fall.  He is on multiple medications for this.  He is concerned the knee is getting worse.  He has tried rubs, ice, heat, knee sleeve and Tylenol with no help.  Review of Systems  Constitutional:  Positive for activity change.  Musculoskeletal:  Positive for arthralgias, gait problem, joint swelling and myalgias.  Neurological:        Occipital neurologia  All other systems reviewed and are negative. For Review of Systems, all other systems reviewed and are negative.  The following is a summary of the past history medically, past history surgically, known current medicines, social history and family history.  This information is gathered electronically by the computer from prior information and documentation.  I review this each visit and have found including this information at this point in the chart is beneficial and informative.   Past Medical History:  Diagnosis Date   Adult onset stuttering    Diabetes mellitus without complication (HCC)    Obesity    Recurrent falls     Past Surgical History:  Procedure Laterality Date   ANKLE FUSION Right    APPENDECTOMY     PARTIAL COLECTOMY       Current Outpatient Medications on File Prior to Visit  Medication Sig Dispense Refill   amitriptyline (ELAVIL) 100 MG tablet 100 mg at bedtime.     canagliflozin (INVOKANA) 300 MG TABS tablet Take 300 mg by mouth daily before breakfast.     cyclobenzaprine (FLEXERIL) 10 MG tablet Take 1 tablet (10 mg total) by mouth 2 (two) times daily as needed for muscle spasms. 20 tablet 0   diclofenac (VOLTAREN) 75 MG EC tablet Take 75 mg by mouth 2 (two) times daily.     gabapentin (NEURONTIN) 300 MG capsule Take by mouth. 2 in am, 2 in pm     HYDROcodone-acetaminophen (NORCO/VICODIN) 5-325 MG per tablet Take 1 tablet by mouth every 6 (six) hours as needed for moderate pain. 30 tablet 0   ibuprofen (ADVIL,MOTRIN) 200 MG tablet Take 200 mg by mouth every 6 (six) hours as needed for moderate pain.     metFORMIN (GLUCOPHAGE) 500 MG tablet Take 1,000 mg by mouth in the morning and at bedtime.     oxyCODONE (ROXICODONE) 15 MG immediate release tablet Take by mouth as needed. Every 6 hrs     pioglitazone (ACTOS) 45 MG tablet Take 45 mg by mouth daily.     rosuvastatin (CRESTOR) 5 MG tablet daily.     tiZANidine (ZANAFLEX) 4 MG tablet Take 4 mg by mouth every  evening.     topiramate (TOPAMAX) 100 MG tablet 100 mg at bedtime.     TRULICITY 1.5 MG/0.5ML SOPN 1.5 mg once a week.     zolpidem (AMBIEN) 10 MG tablet Take by mouth as needed.     No current facility-administered medications on file prior to visit.    Social History   Socioeconomic History   Marital status: Married    Spouse name: Not on file   Number of children: Not on file   Years of education: Not on file   Highest education level: Not on file  Occupational History   Not on file  Tobacco Use   Smoking status: Heavy Smoker    Packs/day: 1.50    Types: Cigarettes   Smokeless tobacco: Former    Quit date: 2015  Substance and Sexual Activity   Alcohol use: No   Drug use: Not on file   Sexual activity: Not on file  Other Topics  Concern   Not on file  Social History Narrative   Not on file   Social Determinants of Health   Financial Resource Strain: Not on file  Food Insecurity: Not on file  Transportation Needs: Not on file  Physical Activity: Not on file  Stress: Not on file  Social Connections: Not on file  Intimate Partner Violence: Not on file    History reviewed. No pertinent family history.  Ht 5\' 10"  (1.778 m)    Wt 269 lb 6 oz (122.2 kg)    BMI 38.65 kg/m   Body mass index is 38.65 kg/m.     Objective:   Physical Exam Vitals and nursing note reviewed. Exam conducted with a chaperone present.  Constitutional:      Appearance: He is well-developed.  HENT:     Head: Normocephalic and atraumatic.  Eyes:     Conjunctiva/sclera: Conjunctivae normal.     Pupils: Pupils are equal, round, and reactive to light.  Cardiovascular:     Rate and Rhythm: Normal rate and regular rhythm.  Pulmonary:     Effort: Pulmonary effort is normal.  Abdominal:     Palpations: Abdomen is soft.  Musculoskeletal:     Cervical back: Normal range of motion and neck supple.       Legs:  Skin:    General: Skin is warm and dry.  Neurological:     Mental Status: He is alert and oriented to person, place, and time.     Cranial Nerves: No cranial nerve deficit.     Motor: No abnormal muscle tone.     Coordination: Coordination normal.     Deep Tendon Reflexes: Reflexes are normal and symmetric. Reflexes normal.  Psychiatric:        Behavior: Behavior normal.        Thought Content: Thought content normal.        Judgment: Judgment normal.          Assessment & Plan:   Encounter Diagnosis  Name Primary?   Chronic pain of left knee Yes   PROCEDURE NOTE:  The patient request injection, verbal consent was obtained.  The left knee was prepped appropriately after time out was performed.   Sterile technique was observed and anesthesia was provided by ethyl chloride and a 20-gauge needle was used to  inject the knee area.  A 16-gauge needle was then used to aspirate the knee.  Color of fluid aspirated was blood tinged  Total cc's aspirated was 30.    Injection of  1 cc of DepoMedrol 40mg  with several cc's of plain xylocaine was then performed.  A band aid dressing was applied.  The patient was advised to apply ice later today and tomorrow to the injection sight as needed.   I would like to get a MRI as he has lack of full extension, positive McMurray, 1+ laxity of MCL and not improving at all after a month.  He may need surgery.  I will see him after the MRI.  Call if any problem.  Precautions discussed.  Electronically Signed , MD 1/4/202310:00 AM

## 2021-04-23 ENCOUNTER — Telehealth: Payer: Self-pay | Admitting: Radiology

## 2021-04-23 NOTE — Telephone Encounter (Signed)
Scheduled for MRI on Feb 3rd, I scheduled post MRI appt.

## 2021-04-28 DIAGNOSIS — I10 Essential (primary) hypertension: Secondary | ICD-10-CM | POA: Diagnosis not present

## 2021-04-28 DIAGNOSIS — E1165 Type 2 diabetes mellitus with hyperglycemia: Secondary | ICD-10-CM | POA: Diagnosis not present

## 2021-05-01 ENCOUNTER — Ambulatory Visit (HOSPITAL_COMMUNITY)
Admission: RE | Admit: 2021-05-01 | Discharge: 2021-05-01 | Disposition: A | Discharge status: Home / Self Care | Payer: Medicare HMO | Source: Ambulatory Visit | Attending: Orthopaedic Surgery | Admitting: Orthopaedic Surgery

## 2021-05-01 ENCOUNTER — Other Ambulatory Visit: Payer: Self-pay

## 2021-05-01 DIAGNOSIS — G8929 Other chronic pain: Secondary | ICD-10-CM | POA: Diagnosis not present

## 2021-05-01 DIAGNOSIS — M25562 Pain in left knee: Secondary | ICD-10-CM | POA: Insufficient documentation

## 2021-05-05 ENCOUNTER — Encounter: Payer: Self-pay | Admitting: Orthopaedic Surgery

## 2021-05-05 ENCOUNTER — Other Ambulatory Visit: Payer: Self-pay

## 2021-05-05 ENCOUNTER — Ambulatory Visit: Payer: Medicare HMO | Admitting: Orthopaedic Surgery

## 2021-05-05 DIAGNOSIS — M25562 Pain in left knee: Secondary | ICD-10-CM

## 2021-05-05 DIAGNOSIS — G8929 Other chronic pain: Secondary | ICD-10-CM

## 2021-05-05 NOTE — Patient Instructions (Addendum)
Appointment with Dr Dallas Schimke or Dr Romeo Apple for knee surgery consult    Meniscus Tear A meniscus tear is a knee injury that happens when a piece of the meniscus is torn. The meniscus is a thick, rubbery, wedge-shaped piece of cartilage in the knee. Each knee has two menisci sitting between the upper bone (femur) and lower bone (tibia) that form the knee joint. Each meniscus acts as a shock absorber for the knee. A torn meniscus is a common knee injury, ranging from mild to severe. Surgery may be needed to repair a severe tear. What are the causes? This condition may be caused by kneeling, squatting, twisting, or pivoting movements. Sports-related injuries are the most common cause, often resulting from: Running and stopping suddenly. Changing direction. Being tackled or knocked off your feet. Lifting or carrying heavy weights. As people get older, their menisci get thinner and weaker. Tears can happen more easily in older people, for example, when climbing stairs. What increases the risk? You are more likely to develop this condition if you: Play contact sports. Have a job that requires kneeling or squatting. Are male. Are over 66 years old. What are the signs or symptoms? Symptoms of this condition include: Knee pain, especially at the side of the knee joint. You may feel pain immediately after injury, or hear a pop and feel pain later. A feeling that your knee is clicking, catching, locking, or giving way (weakness, instability). Not being able to fully bend or extend your knee. Bruising or swelling in your knee. How is this diagnosed? This condition may be diagnosed based on your symptoms and a physical exam. You may also have tests, such as: X-rays. MRI. Arthroscopy. This is a procedure to look inside your knee with a narrow surgical telescope. You may be referred to a knee specialist (orthopedic surgeon). How is this treated? Treatment for this injury depends on the severity of  the tear. Treatment for a mild tear may include: Rest. Medicine to reduce pain and swelling, usually a nonsteroidal anti-inflammatory drug (NSAID), like ibuprofen. A knee brace, sleeve, or wrap. Using crutches or a walker to keep weight off your knee and help with walking. Exercises to strengthen your knee (physical therapy). You may need surgery if you have a severe tear or if other treatments fail. Follow these instructions at home: If you have a brace, sleeve, or wrap: Wear it, as told by your health care provider. Remove it, only as told by your health care provider. Loosen the brace, sleeve, or wrap if your toes tingle, become numb, or turn cold and blue. Keep the brace, sleeve, or wrap clean. If the brace, sleeve, or wrap is not waterproof: Do not let it get wet. Cover it with a watertight covering when you take a bath or shower. Managing pain, stiffness, and swelling  Take over-the-counter and prescription medicines only as told by your health care provider. If directed, put ice on your knee. To do this: If you have a removable brace, sleeve, or wrap, remove it as told by your health care provider. Put ice in a plastic bag. Place a towel between your skin and the bag. Leave the ice on for 20 minutes, 2-3 times per day. Remove the ice if your skin turns bright red. This is very important. If you cannot feel pain, heat, or cold, you have a greater risk of damage to the area. Move your toes often to reduce stiffness and swelling. Raise (elevate) the injured area above the level  of your heart while you are sitting or lying down. Activity Do not use the injured limb to support your body weight until your health care provider says that you can. Use crutches or a walker as told by your health care provider. Return to your normal activities as told by your health care provider. Ask your health care provider what activities are safe for you. Perform range-of-motion exercises only as told  by your health care provider. Begin doing exercises to strengthen your knee and leg muscles only as told by your health care provider. After you recover, your health care provider may recommend these exercises to help prevent another injury. General instructions Use a knee brace, sleeve, or wrap as told by your health care provider. Ask your health care provider when it is safe to drive if you have a brace, sleeve, or wrap on your knee. Do not use any products that contain nicotine or tobacco, such as cigarettes, e-cigarettes, and chewing tobacco. If you need help quitting, ask your health care provider. Ask your health care provider if the medicine prescribed to you: Requires you to avoid driving or using heavy machinery. Can cause constipation. You may need to take these actions to prevent or treat constipation: Drink enough fluid to keep your urine pale yellow. Take over-the-counter or prescription medicines. Eat foods that are high in fiber, such as beans, whole grains, and fresh fruits and vegetables. Limit foods that are high in fat and processed sugars, such as fried or sweet foods. Keep all follow-up visits. This is important. Contact a health care provider if: You have a fever. Your knee becomes red, tender, or swollen. Your pain medicine is not controlling your pain. Your symptoms get worse or do not improve after 2 weeks of home care. Summary A meniscus tear is a knee injury that happens when a piece of the meniscus is torn. Treatment for this injury depends on the severity of the tear. You may need surgery if you have a severe tear or if other treatments fail. Rest, ice, and raise (elevate) your injured knee, as told by your health care provider. This will help lessen pain and swelling. Contact a health care provider if you have new symptoms, your symptoms worsen, or they do not improve after 2 weeks of home care. Keep all follow-up visits. This is important. This information is  not intended to replace advice given to you by your health care provider. Make sure you discuss any questions you have with your health care provider. Document Revised: 07/26/2019 Document Reviewed: 07/26/2019 Elsevier Patient Education  2022 [employer].

## 2021-05-05 NOTE — Progress Notes (Signed)
My knee hurts  His left knee has giving way, swelling and just hurts.  He wears a knee sleeve with only slight help.  MRI of the left knee was done showing: IMPRESSION: 1. Severe complex tear of the posterior horn-body junction of the medial meniscus. 2. Tricompartmental cartilage abnormalities as described above. 3. Moderate joint effusion.  I have explained the findings to him.  I have shown him a model of the knee.  I have recommended arthroscopy of the knee.  I will have him see Dr. Romeo Apple or Dr. Dallas Schimke.  He is agreeable to this. I have explained the surgery.  Left knee has effusion, medial joint line pain, positive medial McMurray, crepitus, limp left, NV intact.  ROM 0 to 105 with pain in extension and flexion.  I have independently reviewed the MRI.      Encounter Diagnosis  Name Primary?   Chronic pain of left knee Yes   To see Dr. Salena Saner or Dr. Rexene Edison.  Call if any problem.  Precautions discussed.  Electronically Signed Darreld Mclean, MD 2/7/20239:09 AM

## 2021-05-13 ENCOUNTER — Other Ambulatory Visit: Payer: Self-pay

## 2021-05-13 ENCOUNTER — Ambulatory Visit: Payer: Medicare HMO | Admitting: Orthopedic Surgery

## 2021-05-13 ENCOUNTER — Encounter: Payer: Self-pay | Admitting: Orthopedic Surgery

## 2021-05-13 DIAGNOSIS — S83232A Complex tear of medial meniscus, current injury, left knee, initial encounter: Secondary | ICD-10-CM | POA: Diagnosis not present

## 2021-05-13 DIAGNOSIS — M25562 Pain in left knee: Secondary | ICD-10-CM

## 2021-05-13 DIAGNOSIS — G8929 Other chronic pain: Secondary | ICD-10-CM

## 2021-05-13 NOTE — Progress Notes (Signed)
New Patient Visit  Assessment: Tyrone Peterson is a 56 y.o. male with the following: 1. Chronic pain of left knee 2. Complex tear of medial meniscus of left knee as current injury, initial encounter  Plan: Reviewed radiographs and the MRI findings with the patient in clinic today.  His pain is primarily medial.  He does have a meniscus injury, but also has some degenerative changes, particularly within the patellofemoral joint.  He is yet to work with physical therapy.  I think this is a reasonable option.  He had an injection in his left knee approximately 6 weeks ago.  I think it is reasonable to wait for further consideration regarding surgery.  He is aware of that surgery may or may not improve his symptoms.  He is also aware that it is possible it can worsen his current symptoms.  He states his understanding.  Follow-up in about 6 weeks.  We placed a referral for physical therapy today.   Follow-up: Return in about 6 weeks (around 06/24/2021).  Subjective:  Chief Complaint  Patient presents with   Consultation    Discuss LT knee surgery    History of Present Illness: Tyrone Peterson is a 56 y.o. male who presents for evaluation of left knee pain.  He has previously been evaluated by Dr. Hilda Lias.  He sustained a fall, several months ago.  Since then, he has had pain in the left knee.  He has been taking medications, limited improvement.  Pain is primarily in the medial aspect of the knee.  He has not worked with physical therapy.  Steroid injections have not provided sustained relief.   Review of Systems: No fevers or chills No numbness or tingling No chest pain No shortness of breath No bowel or bladder dysfunction No GI distress No headaches   Medical History:  Past Medical History:  Diagnosis Date   Adult onset stuttering    Diabetes mellitus without complication (HCC)    Obesity    Recurrent falls     Past Surgical History:  Procedure Laterality Date    ANKLE FUSION Right    APPENDECTOMY     PARTIAL COLECTOMY      No family history on file. Social History   Tobacco Use   Smoking status: Heavy Smoker    Packs/day: 1.50    Types: Cigarettes   Smokeless tobacco: Former    Quit date: 2015  Substance Use Topics   Alcohol use: No    No Known Allergies  Current Meds  Medication Sig   amitriptyline (ELAVIL) 100 MG tablet 100 mg at bedtime.   canagliflozin (INVOKANA) 300 MG TABS tablet Take 300 mg by mouth daily before breakfast.   cyclobenzaprine (FLEXERIL) 10 MG tablet Take 1 tablet (10 mg total) by mouth 2 (two) times daily as needed for muscle spasms.   diclofenac (VOLTAREN) 75 MG EC tablet Take 75 mg by mouth 2 (two) times daily.   gabapentin (NEURONTIN) 300 MG capsule Take by mouth. 2 in am, 2 in pm   HYDROcodone-acetaminophen (NORCO/VICODIN) 5-325 MG per tablet Take 1 tablet by mouth every 6 (six) hours as needed for moderate pain.   ibuprofen (ADVIL,MOTRIN) 200 MG tablet Take 200 mg by mouth every 6 (six) hours as needed for moderate pain.   metFORMIN (GLUCOPHAGE) 500 MG tablet Take 1,000 mg by mouth in the morning and at bedtime.   oxyCODONE (ROXICODONE) 15 MG immediate release tablet Take by mouth as needed. Every 6 hrs   pioglitazone (  ACTOS) 45 MG tablet Take 45 mg by mouth daily.   rosuvastatin (CRESTOR) 5 MG tablet daily.   tiZANidine (ZANAFLEX) 4 MG tablet Take 4 mg by mouth every evening.   topiramate (TOPAMAX) 100 MG tablet 100 mg at bedtime.   TRULICITY 1.5 MG/0.5ML SOPN 1.5 mg once a week.   zolpidem (AMBIEN) 10 MG tablet Take by mouth as needed.    Objective: There were no vitals taken for this visit.  Physical Exam:  General: Alert and oriented. and No acute distress. Gait: Left sided antalgic gait.  Evaluation of the left knee demonstrates a mild effusion.  Tenderness to palpation over the medial joint line.  Minimal translation of the patella.  Some pain with patella compression.  He has pain with range  of motion testing.  He is able to get the leg straight.  He tolerates flexion to approximately 100 degrees.  IMAGING: I personally reviewed images previously obtained in clinic  Left knee MRI  IMPRESSION: 1. Severe complex tear of the posterior horn-body junction of the medial meniscus. 2. Tricompartmental cartilage abnormalities as described above. 3. Moderate joint effusion.   New Medications:  No orders of the defined types were placed in this encounter.     Oliver Barre, MD  05/13/2021 10:30 PM

## 2021-05-16 DIAGNOSIS — M25562 Pain in left knee: Secondary | ICD-10-CM | POA: Diagnosis not present

## 2021-05-18 ENCOUNTER — Telehealth: Payer: Self-pay | Admitting: Orthopedic Surgery

## 2021-05-18 NOTE — Telephone Encounter (Signed)
Call (voice message) received from patient - returned call; phone # on file [phone] does not go through; tried number on message [phone] - no answer or voice mail. Patient's message said he was feeling some numbness in the leg, like pins and needles. Will try back to try to reach.

## 2021-05-20 ENCOUNTER — Other Ambulatory Visit: Payer: Self-pay

## 2021-05-20 ENCOUNTER — Encounter: Payer: Self-pay | Admitting: Orthopedic Surgery

## 2021-05-20 ENCOUNTER — Ambulatory Visit: Payer: Medicare HMO | Admitting: Orthopedic Surgery

## 2021-05-20 DIAGNOSIS — G8929 Other chronic pain: Secondary | ICD-10-CM | POA: Diagnosis not present

## 2021-05-20 DIAGNOSIS — M25562 Pain in left knee: Secondary | ICD-10-CM | POA: Diagnosis not present

## 2021-05-20 DIAGNOSIS — S83232D Complex tear of medial meniscus, current injury, left knee, subsequent encounter: Secondary | ICD-10-CM | POA: Diagnosis not present

## 2021-05-20 NOTE — Progress Notes (Signed)
New Patient Visit  Assessment: Tyrone Peterson is a 56 y.o. male with the following: 1. Chronic pain of left knee; acute worsening 2. Complex tear of medial meniscus of left knee as current injury, initial encounter  Plan: Patient was stepping out of his truck, and noted a popping sensation.  Since then, has had worsening pain and swelling in the left knee.  He also describes a "numbness sensation" in his left lower leg.  He saw his PCP yesterday, who provided him with a prescription for prednisone.  I think this is a reasonable next step.  I think this could help with his inflammation and irritation.  He should continue with his physical therapy as tolerated.  We will plan to see him back at his next scheduled appointment at the end of March.  If he has any issues before then, he should contact the clinic for an earlier appointment.     Follow-up: Return if symptoms worsen or fail to improve, for As scheduled.  Subjective:  Chief Complaint  Patient presents with   Knee Pain    LT// painful and having paresthesia from knee to toes (just under knee cap)     History of Present Illness: Tyrone Peterson is a 56 y.o. male who presents for repeat evaluation of his left knee.  He was seen in clinic a couple of weeks ago, at which time we recommended physical therapy.  He states that he was stepping out of his truck a couple of days ago.  He felt a popping sensation in his left knee.  Since then, he had worsening pain in his knee, as well as numbness in the lower leg.  He is having difficulty ambulating without assistance.  He wraps his knee with an Ace wrap, as well as a knee brace.  Review of Systems: No fevers or chills No numbness or tingling No chest pain No shortness of breath No bowel or bladder dysfunction No GI distress No headaches  Objective: There were no vitals taken for this visit.  Physical Exam:  General: Alert and oriented. and No acute distress. Gait:  Left sided antalgic gait.  Left knee with worsening swelling.  Diffuse tenderness to palpation.  The knee is warm to the touch.  No redness is appreciated.  Sensation is intact throughout his lower extremity.  His lower leg feels like dead weight.   IMAGING: I personally reviewed images previously obtained in clinic  No new imaging obtained today.  New Medications:  No orders of the defined types were placed in this encounter.     Oliver Barre, MD  05/20/2021 10:06 AM

## 2021-05-20 NOTE — Patient Instructions (Signed)
Return to clinic as previously scheduled

## 2021-05-26 DIAGNOSIS — E1165 Type 2 diabetes mellitus with hyperglycemia: Secondary | ICD-10-CM | POA: Diagnosis not present

## 2021-05-26 DIAGNOSIS — I10 Essential (primary) hypertension: Secondary | ICD-10-CM | POA: Diagnosis not present

## 2021-05-27 ENCOUNTER — Encounter (HOSPITAL_COMMUNITY): Payer: Self-pay | Admitting: Physical Therapy

## 2021-05-27 ENCOUNTER — Other Ambulatory Visit: Payer: Self-pay

## 2021-05-27 ENCOUNTER — Ambulatory Visit (HOSPITAL_COMMUNITY): Payer: Medicare HMO | Attending: Orthopedic Surgery | Admitting: Physical Therapy

## 2021-05-27 DIAGNOSIS — M25662 Stiffness of left knee, not elsewhere classified: Secondary | ICD-10-CM | POA: Diagnosis not present

## 2021-05-27 DIAGNOSIS — R2689 Other abnormalities of gait and mobility: Secondary | ICD-10-CM | POA: Diagnosis not present

## 2021-05-27 DIAGNOSIS — M25562 Pain in left knee: Secondary | ICD-10-CM | POA: Insufficient documentation

## 2021-05-27 DIAGNOSIS — R29898 Other symptoms and signs involving the musculoskeletal system: Secondary | ICD-10-CM | POA: Insufficient documentation

## 2021-05-27 DIAGNOSIS — G8929 Other chronic pain: Secondary | ICD-10-CM | POA: Diagnosis not present

## 2021-05-27 DIAGNOSIS — M6281 Muscle weakness (generalized): Secondary | ICD-10-CM | POA: Diagnosis not present
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
## 2021-06-01 ENCOUNTER — Other Ambulatory Visit: Payer: Self-pay

## 2021-06-01 ENCOUNTER — Ambulatory Visit (HOSPITAL_COMMUNITY): Payer: Medicare HMO

## 2021-06-01 DIAGNOSIS — R29898 Other symptoms and signs involving the musculoskeletal system: Secondary | ICD-10-CM

## 2021-06-01 DIAGNOSIS — R2689 Other abnormalities of gait and mobility: Secondary | ICD-10-CM

## 2021-06-01 DIAGNOSIS — M6281 Muscle weakness (generalized): Secondary | ICD-10-CM | POA: Diagnosis not present

## 2021-06-01 DIAGNOSIS — G8929 Other chronic pain: Secondary | ICD-10-CM | POA: Diagnosis not present

## 2021-06-01 DIAGNOSIS — M25562 Pain in left knee: Secondary | ICD-10-CM | POA: Diagnosis not present

## 2021-06-01 DIAGNOSIS — M25662 Stiffness of left knee, not elsewhere classified: Secondary | ICD-10-CM
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
## 2021-06-03 ENCOUNTER — Ambulatory Visit (HOSPITAL_COMMUNITY): Payer: Medicare HMO
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
## 2021-06-09 ENCOUNTER — Other Ambulatory Visit: Payer: Self-pay

## 2021-06-09 ENCOUNTER — Ambulatory Visit (HOSPITAL_COMMUNITY): Payer: Medicare HMO

## 2021-06-09 DIAGNOSIS — M25562 Pain in left knee: Secondary | ICD-10-CM

## 2021-06-09 DIAGNOSIS — G8929 Other chronic pain: Secondary | ICD-10-CM | POA: Diagnosis not present

## 2021-06-09 DIAGNOSIS — R29898 Other symptoms and signs involving the musculoskeletal system: Secondary | ICD-10-CM | POA: Diagnosis not present

## 2021-06-09 DIAGNOSIS — M6281 Muscle weakness (generalized): Secondary | ICD-10-CM | POA: Diagnosis not present

## 2021-06-09 DIAGNOSIS — R2689 Other abnormalities of gait and mobility: Secondary | ICD-10-CM

## 2021-06-09 DIAGNOSIS — M25662 Stiffness of left knee, not elsewhere classified: Secondary | ICD-10-CM
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
## 2021-06-11 ENCOUNTER — Encounter (HOSPITAL_COMMUNITY): Payer: Medicare HMO

## 2021-06-16 ENCOUNTER — Ambulatory Visit (HOSPITAL_COMMUNITY): Payer: Medicare HMO | Admitting: Physical Therapy

## 2021-06-16 ENCOUNTER — Encounter (HOSPITAL_COMMUNITY): Payer: Self-pay | Admitting: Physical Therapy

## 2021-06-16 ENCOUNTER — Other Ambulatory Visit: Payer: Self-pay

## 2021-06-16 DIAGNOSIS — M6281 Muscle weakness (generalized): Secondary | ICD-10-CM | POA: Diagnosis not present

## 2021-06-16 DIAGNOSIS — G8929 Other chronic pain: Secondary | ICD-10-CM | POA: Diagnosis not present

## 2021-06-16 DIAGNOSIS — M25662 Stiffness of left knee, not elsewhere classified: Secondary | ICD-10-CM | POA: Diagnosis not present

## 2021-06-16 DIAGNOSIS — R29898 Other symptoms and signs involving the musculoskeletal system: Secondary | ICD-10-CM

## 2021-06-16 DIAGNOSIS — R2689 Other abnormalities of gait and mobility: Secondary | ICD-10-CM | POA: Diagnosis not present

## 2021-06-16 DIAGNOSIS — M25562 Pain in left knee: Secondary | ICD-10-CM | POA: Diagnosis not present
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
## 2021-06-18 ENCOUNTER — Other Ambulatory Visit: Payer: Self-pay

## 2021-06-18 ENCOUNTER — Ambulatory Visit (HOSPITAL_COMMUNITY): Payer: Medicare HMO | Admitting: Physical Therapy

## 2021-06-18 ENCOUNTER — Encounter (HOSPITAL_COMMUNITY): Payer: Self-pay | Admitting: Physical Therapy

## 2021-06-18 DIAGNOSIS — M25562 Pain in left knee: Secondary | ICD-10-CM | POA: Diagnosis not present

## 2021-06-18 DIAGNOSIS — R2689 Other abnormalities of gait and mobility: Secondary | ICD-10-CM | POA: Diagnosis not present

## 2021-06-18 DIAGNOSIS — R29898 Other symptoms and signs involving the musculoskeletal system: Secondary | ICD-10-CM | POA: Diagnosis not present

## 2021-06-18 DIAGNOSIS — M25662 Stiffness of left knee, not elsewhere classified: Secondary | ICD-10-CM | POA: Diagnosis not present

## 2021-06-18 DIAGNOSIS — M6281 Muscle weakness (generalized): Secondary | ICD-10-CM | POA: Diagnosis not present

## 2021-06-18 DIAGNOSIS — G8929 Other chronic pain: Secondary | ICD-10-CM | POA: Diagnosis not present
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
## 2021-06-18 NOTE — Patient Instructions (Signed)
?  Access Code: [email or web address] ?URL: [email or web address] ?Date: 06/18/2021 ?Prepared by: Mitzi Hansen Suhayb Anzalone ? ?Exercises ?- Step Up  - 1 x daily - 7 x weekly - 2 sets - 10 reps ? ?

## 2021-06-23 ENCOUNTER — Ambulatory Visit (HOSPITAL_COMMUNITY): Payer: Medicare HMO | Admitting: Physical Therapy

## 2021-06-23 ENCOUNTER — Other Ambulatory Visit: Payer: Self-pay

## 2021-06-23 DIAGNOSIS — G8929 Other chronic pain: Secondary | ICD-10-CM | POA: Diagnosis not present

## 2021-06-23 DIAGNOSIS — M25562 Pain in left knee: Secondary | ICD-10-CM

## 2021-06-23 DIAGNOSIS — M6281 Muscle weakness (generalized): Secondary | ICD-10-CM

## 2021-06-23 DIAGNOSIS — M25662 Stiffness of left knee, not elsewhere classified: Secondary | ICD-10-CM | POA: Diagnosis not present

## 2021-06-23 DIAGNOSIS — R29898 Other symptoms and signs involving the musculoskeletal system: Secondary | ICD-10-CM | POA: Diagnosis not present

## 2021-06-23 DIAGNOSIS — R2689 Other abnormalities of gait and mobility: Secondary | ICD-10-CM
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
## 2021-06-24 ENCOUNTER — Ambulatory Visit: Payer: Medicare HMO | Admitting: Orthopedic Surgery

## 2021-06-24 ENCOUNTER — Encounter: Payer: Self-pay | Admitting: Orthopedic Surgery

## 2021-06-24 VITALS — Ht 70.0 in | Wt 269.0 lb

## 2021-06-24 DIAGNOSIS — M25562 Pain in left knee: Secondary | ICD-10-CM

## 2021-06-24 DIAGNOSIS — S83232D Complex tear of medial meniscus, current injury, left knee, subsequent encounter: Secondary | ICD-10-CM | POA: Diagnosis not present

## 2021-06-24 DIAGNOSIS — G8929 Other chronic pain: Secondary | ICD-10-CM | POA: Diagnosis not present

## 2021-06-24 MED ORDER — CELECOXIB 100 MG PO CAPS: 100.0000 mg | ORAL_CAPSULE | Freq: Two times a day (BID) | ORAL | 1 refills | Status: DC

## 2021-06-25 ENCOUNTER — Encounter: Payer: Self-pay | Admitting: Orthopedic Surgery

## 2021-06-25 ENCOUNTER — Encounter (HOSPITAL_COMMUNITY): Payer: Medicare HMO | Admitting: Physical Therapy

## 2021-06-25 NOTE — Progress Notes (Signed)
New Patient Visit ? ?Assessment: ?BOSTEN NEWSTROM is a 56 y.o. male with the following: ?1. Chronic pain of left knee ?2. Complex tear of medial meniscus of left knee as current injury, initial encounter ? ?Plan: ?Persistent left knee pain.  He has had some improvement since I last saw him in clinic.  He notes more mobility, less swelling in his left knee.  He remains reluctant to consider surgery.  Based on the results of the MRI, I am not convinced that addressing the medial meniscus injury with improving his ongoing pain.  We discussed multiple treatment options in clinic today, and have provided him with a prescription for Celebrex.  In addition, he is going to consider hyaluronic acid injections.  We will have these authorized by his insurance company, and he can schedule an appointment if he wishes to proceed. ? ? ?Follow-up: ?Return if symptoms worsen or fail to improve. ? ?Subjective: ? ?Chief Complaint  ?Patient presents with  ? Knee Pain  ?  Lt knee pain feels he has a little more mobility. Still having swelling and pain after PT.   ? ? ?History of Present Illness: ?AERION BAGDASARIAN is a 56 y.o. male who presents for repeat evaluation of his left knee.  He has been seen in clinic multiple times for his left knee.  At the last visit, he had cute worsening of pain in the left knee.  Since then, he has worked with physical therapy.  He notes improvements in his swelling, as well as range of motion.  However, he continues to have severe pain throughout the left knee.  Pain is primarily along the medial lateral joint lines.  He is not using an assistive device in clinic today. ? ? ?Review of Systems: ?No fevers or chills ?No numbness or tingling ?No chest pain ?No shortness of breath ?No bowel or bladder dysfunction ?No GI distress ?No headaches ? ?Objective: ?Ht 5\' 10"  (1.778 m)   Wt 269 lb (122 kg)   BMI 38.60 kg/m?  ? ?Physical Exam: ? ?General: Alert and oriented. and No acute distress. ?Gait:  Left sided antalgic gait. ? ?Left knee with minimal swelling.  Crepitus with range of motion testing within the patellofemoral joint.  Positive patellar grind testing.  No increased laxity varus stress.  He is able to achieve full extension.  Flexion to approximately 100 degrees..  Sensation is intact throughout his lower extremity. ? ? ?IMAGING: ?I personally reviewed images previously obtained in clinic ? ?No new imaging obtained today. ? ?New Medications:  ?Meds ordered this encounter  ?Medications  ? celecoxib (CELEBREX) 100 MG capsule  ?  Sig: Take 1 capsule (100 mg total) by mouth 2 (two) times daily.  ?  Dispense:  60 capsule  ?  Refill:  1  ? ? ? ? ? , MD ? ?06/25/2021 ?1:08 PM ? ? ?

## 2021-06-26 DIAGNOSIS — E1165 Type 2 diabetes mellitus with hyperglycemia: Secondary | ICD-10-CM | POA: Diagnosis not present

## 2021-06-26 DIAGNOSIS — I10 Essential (primary) hypertension: Secondary | ICD-10-CM | POA: Diagnosis not present

## 2021-06-30 ENCOUNTER — Encounter (HOSPITAL_COMMUNITY): Payer: Medicare HMO | Admitting: Physical Therapy

## 2021-07-02 ENCOUNTER — Encounter (HOSPITAL_COMMUNITY): Payer: Medicare HMO | Admitting: Physical Therapy

## 2021-07-07 ENCOUNTER — Encounter (HOSPITAL_COMMUNITY): Payer: Medicare HMO | Admitting: Physical Therapy

## 2021-07-09 ENCOUNTER — Encounter (HOSPITAL_COMMUNITY): Payer: Medicare HMO

## 2021-07-20 ENCOUNTER — Telehealth: Payer: Self-pay | Admitting: Orthopedic Surgery

## 2021-07-20 NOTE — Telephone Encounter (Signed)
Patient called and said he wants to do the surgery instead of the injections.  Please call him back to schedule surgery.   ?

## 2021-07-22 ENCOUNTER — Encounter: Payer: Self-pay | Admitting: Orthopedic Surgery

## 2021-07-22 ENCOUNTER — Ambulatory Visit (INDEPENDENT_AMBULATORY_CARE_PROVIDER_SITE_OTHER): Payer: Medicare HMO | Admitting: Orthopedic Surgery

## 2021-07-22 DIAGNOSIS — S83232D Complex tear of medial meniscus, current injury, left knee, subsequent encounter: Secondary | ICD-10-CM | POA: Diagnosis not present

## 2021-07-22 NOTE — Progress Notes (Signed)
Orthopaedic Clinic Return - Virtual Telephone Visit ?  ?**Visit was conducted via telephone at the patient's request.  No vital signs or physical exam were completed.  All previous medical records were readily available during my conversation with the patient.  In total, I was on the phone with the patient for 15 minutes** ?  ?Location: ?Patient: Home ?Provider: Clinic ? ?[phone] - 11-20 minutes  ? ?Assessment: ?MONTEL VANDERHOOF is a 56 y.o. male with the following: ?Left knee pain, with complex tear of the medial meniscus ? ?Plan: ?Patient continues to have pain in the left knee.  He has tried injections, medications and physical therapy.  He has not achieved sustained relief.  We have previously discussed proceeding with a left knee arthroscopy, and he is interested in pursuing this.  We had extensive discussion today regarding the plan for surgery.  He does have some cartilage wear within the left knee, as demonstrated on the MRI.  As a result, he may have incomplete resolution of the pain in his left knee.  He is aware.  He states his understanding.  He is willing to accept this potential risk.  He is interested in pursuing left knee arthroscopy.  He will need to obtain medical clearance prior to scheduling a surgical date.  We will reach out to his primary care provider, in order to get the appropriate clearance.  I have also asked him to contact his doctor in order to facilitate medical clearance. ? ? ?Follow-up: ?Return for After medical clearance for OR. ? ? ?Subjective: ? ?Chief Complaint  ?Patient presents with  ? Follow-up  ?  Left knee pain  ? ? ?History of Present Illness: ?I connected with  IMER FOXWORTH on 07/22/21 via telephone and verified that I am speaking with the correct person using two identifiers. ?  ?I discussed the limitations of evaluation and management by telemedicine. The patient expressed understanding and agreed to proceed. ? ? ?HYATT CAPOBIANCO is a 56 y.o. male who  continues to have left knee pain.  He has tried injections.  He is work with physical therapy.  His symptoms have improved.  However, he feels the symptoms progressively worsening, as the steroid injection potentially wears off.  At the last visit, we discussed proceeding with hyaluronic acid injections.  However, he would like to forego these injections at this time.  He is more interested in scheduling surgery. ? ?Review of Systems: ?No fevers or chills ?No numbness or tingling ?No chest pain ?No shortness of breath ?No bowel or bladder dysfunction ?No GI distress ?No headaches ? ? ?Objective: ?No vital signs ? ?Physical Exam: ? ?Telephone consult - No exam  ? ?IMAGING: ?I personally ordered and reviewed the following images: ? ?No new imaging today ? ?Oliver Barre, MD ?07/22/2021 ?11:25 PM ? ?  ?

## 2021-07-26 DIAGNOSIS — E1165 Type 2 diabetes mellitus with hyperglycemia: Secondary | ICD-10-CM | POA: Diagnosis not present

## 2021-07-26 DIAGNOSIS — E782 Mixed hyperlipidemia: Secondary | ICD-10-CM | POA: Diagnosis not present

## 2021-07-26 DIAGNOSIS — I10 Essential (primary) hypertension: Secondary | ICD-10-CM | POA: Diagnosis not present

## 2021-08-11 ENCOUNTER — Other Ambulatory Visit: Payer: Self-pay | Admitting: Orthopedic Surgery

## 2021-08-20 ENCOUNTER — Other Ambulatory Visit: Payer: Self-pay

## 2021-08-20 DIAGNOSIS — S83232D Complex tear of medial meniscus, current injury, left knee, subsequent encounter: Secondary | ICD-10-CM

## 2021-08-25 DIAGNOSIS — E119 Type 2 diabetes mellitus without complications: Secondary | ICD-10-CM | POA: Diagnosis not present

## 2021-08-26 DIAGNOSIS — E1165 Type 2 diabetes mellitus with hyperglycemia: Secondary | ICD-10-CM | POA: Diagnosis not present

## 2021-08-26 DIAGNOSIS — E782 Mixed hyperlipidemia: Secondary | ICD-10-CM | POA: Diagnosis not present

## 2021-08-26 DIAGNOSIS — I10 Essential (primary) hypertension: Secondary | ICD-10-CM | POA: Diagnosis not present

## 2021-08-27 DIAGNOSIS — I10 Essential (primary) hypertension: Secondary | ICD-10-CM | POA: Diagnosis not present

## 2021-08-27 DIAGNOSIS — E559 Vitamin D deficiency, unspecified: Secondary | ICD-10-CM | POA: Diagnosis not present

## 2021-08-27 DIAGNOSIS — E119 Type 2 diabetes mellitus without complications: Secondary | ICD-10-CM | POA: Diagnosis not present

## 2021-09-01 DIAGNOSIS — I10 Essential (primary) hypertension: Secondary | ICD-10-CM | POA: Diagnosis not present

## 2021-09-01 DIAGNOSIS — R000 Tachycardia, unspecified: Secondary | ICD-10-CM | POA: Diagnosis not present

## 2021-09-01 DIAGNOSIS — G8929 Other chronic pain: Secondary | ICD-10-CM | POA: Diagnosis not present

## 2021-09-01 DIAGNOSIS — N1831 Chronic kidney disease, stage 3a: Secondary | ICD-10-CM | POA: Diagnosis not present

## 2021-09-01 DIAGNOSIS — E785 Hyperlipidemia, unspecified: Secondary | ICD-10-CM | POA: Diagnosis not present

## 2021-09-01 DIAGNOSIS — E782 Mixed hyperlipidemia: Secondary | ICD-10-CM | POA: Diagnosis not present

## 2021-09-01 DIAGNOSIS — G4700 Insomnia, unspecified: Secondary | ICD-10-CM | POA: Diagnosis not present

## 2021-09-01 DIAGNOSIS — E559 Vitamin D deficiency, unspecified: Secondary | ICD-10-CM | POA: Diagnosis not present

## 2021-09-01 DIAGNOSIS — E119 Type 2 diabetes mellitus without complications: Secondary | ICD-10-CM | POA: Diagnosis not present

## 2021-09-09 ENCOUNTER — Other Ambulatory Visit: Payer: Self-pay

## 2021-09-09 ENCOUNTER — Encounter (HOSPITAL_COMMUNITY): Payer: Self-pay | Admitting: Physical Therapy

## 2021-09-09 DIAGNOSIS — Z01818 Encounter for other preprocedural examination: Secondary | ICD-10-CM

## 2021-09-09 DIAGNOSIS — S83232D Complex tear of medial meniscus, current injury, left knee, subsequent encounter: Secondary | ICD-10-CM

## 2021-09-09 NOTE — Therapy (Signed)
[place], [place], [address] Phone: [phone]   Fax:  [phone]  Patient Details  Name: Tyrone Peterson MRN: [id number] Date of Birth: February 13, 1966 Referring Provider:  No ref. provider found  Encounter Date: 09/09/2021  PHYSICAL THERAPY DISCHARGE SUMMARY  Visits from Start of Care: 6  Current functional level related to goals / functional outcomes: Patient has not returned   Remaining deficits: Patient has not returned   Education / Equipment: HEP   Patient agrees to discharge. Patient goals were not met. Patient is being discharged due to not returning since the last visit.    Vianne Bulls Truth Wolaver, PT 09/09/2021, 10:25 AM  [place] [address] [place], [place], [address] Phone: [phone]   Fax:  [phone]

## 2021-09-17 DIAGNOSIS — Z0001 Encounter for general adult medical examination with abnormal findings: Secondary | ICD-10-CM | POA: Diagnosis not present

## 2021-09-25 DIAGNOSIS — E782 Mixed hyperlipidemia: Secondary | ICD-10-CM | POA: Diagnosis not present

## 2021-09-25 DIAGNOSIS — E1165 Type 2 diabetes mellitus with hyperglycemia: Secondary | ICD-10-CM | POA: Diagnosis not present

## 2021-09-25 DIAGNOSIS — I10 Essential (primary) hypertension: Secondary | ICD-10-CM | POA: Diagnosis not present

## 2021-10-01 NOTE — Patient Instructions (Signed)
Tyrone Peterson [id number]  10/01/2021     @PREFPERIOPPHARMACY @   Your procedure is scheduled on  10/08/2021.   Report to [facility] at  0800 A.M.   Call this number if you have problems the morning of surgery:  [phone]   Remember:  Do not eat or drink after midnight.       Your last dose of trulicity should have been on 7/5 or before.    Take 10 units of your night time insulin the night before your procedure.     DO NOT take any medications for diabetes the morning of your procedure.    Take these medicines the morning of surgery with A SIP OF WATER               Neurontin, roxicodone(If needed).     Do not wear jewelry, make-up or nail polish.  Do not wear lotions, powders, or perfumes, or deodorant.  Do not shave 48 hours prior to surgery.  Men may shave face and neck.  Do not bring valuables to the hospital.  [facility] is not responsible for any belongings or valuables.  Contacts, dentures or bridgework may not be worn into surgery.  Leave your suitcase in the car.  After surgery it may be brought to your room.  For patients admitted to the hospital, discharge time will be determined by your treatment team.  Patients discharged the day of surgery will not be allowed to drive home and must have someone with them for 24 hours.    Special instructions:   DO NOT smoke tobacco or vape for 24 hours before your procedure.  Please read over the following fact sheets that you were given. Coughing and Deep Breathing, Surgical Site Infection Prevention, Anesthesia Post-op Instructions, and Care and Recovery After Surgery      Arthroscopic Knee Ligament Repair, Care After This sheet gives you information about how to care for yourself after your procedure. Your health care provider may also give you more specific instructions. If you have problems or questions, contact your health care provider. What can I expect after the procedure? After the procedure, it  is common to have: Soreness or pain in your knee. Bruising and swelling on your knee, calf, and ankle for 3-4 days. A small amount of fluid coming from the incisions. Follow these instructions at home: Medicines Take over-the-counter and prescription medicines only as told by your health care provider. Ask your health care provider if the medicine prescribed to you: Requires you to avoid driving or using machinery. Can cause constipation. You may need to take these actions to prevent or treat constipation: Drink enough fluid to keep your urine pale yellow. Take over-the-counter or prescription medicines. Eat foods that are high in fiber, such as beans, whole grains, and fresh fruits and vegetables. Limit foods that are high in fat and processed sugars, such as fried or sweet foods. If you have a brace or immobilizer: Wear it as told by your health care provider. Remove it only as told by your health care provider. Loosen it if your toes tingle, become numb, or turn cold and blue. Keep it clean and dry. Ask your health care provider when it is safe to drive. Bathing Do not take baths, swim, or use a hot tub until your health care provider approves. Keep your bandage (dressing) dry until your health care provider says that it can be removed. If the brace or immobilizer  is not waterproof: Do not let it get wet. Cover it with a watertight covering when you take a bath or shower. Incision care  Follow instructions from your health care provider about how to take care of your incisions. Make sure you: Wash your hands with soap and water for at least 20 seconds before and after you change your dressing. If soap and water are not available, use hand sanitizer. Change your dressing as told by your health care provider. Leave stitches (sutures), skin glue, or adhesive strips in place. These skin closures may need to stay in place for 2 weeks or longer. If adhesive strip edges start to loosen and  curl up, you may trim the loose edges. Do not remove adhesive strips completely unless your health care provider tells you to do that. Check your incision areas every day for signs of infection. Check for: Redness. More swelling or pain. Blood or more fluid. Warmth. Pus or a bad smell. Managing pain, stiffness, and swelling  If directed, put ice on the affected area. To do this: If you have a removable brace or immobilizer, remove it as told by your health care provider. Put ice in a plastic bag. Place a towel between your skin and the bag. Leave the ice on for 20 minutes, 2-3 times a day. Remove the ice if your skin turns bright red. This is very important. If you cannot feel pain, heat, or cold, you have a greater risk of damage to the area. Move your toes often to reduce stiffness and swelling. Raise (elevate) the injured area above the level of your heart while you are sitting or lying down. Activity Do not use your knee to support your body weight until your health care provider says that you can. Use crutches or other devices as told by your health care provider. Do physical therapy exercises as told by your health care provider. Physical therapy will help you regain movement and strength in your knee. Follow instructions from your health care provider about: When you may start motion exercises. When you may start riding a stationary bike and doing other low-impact activities. When you may start to jog and do other high-impact activities. Do not lift anything that is heavier than 10 lb (4.5 kg), or the limit that you are told, until your health care provider says that it is safe. Ask your health care provider what activities are safe for you. General instructions Do not use any products that contain nicotine or tobacco, such as cigarettes, e-cigarettes, and chewing tobacco. These can delay healing. If you need help quitting, ask your health care provider. Wear compression stockings  as told by your health care provider. These stockings help to prevent blood clots and reduce swelling in your legs. Keep all follow-up visits. This is important. Contact a health care provider if: You have any of these signs of infection: Redness around an incision. Blood or more fluid coming from an incision. Warmth coming from an incision. Pus or a bad smell coming from an incision. More swelling or pain in your knee. A fever or chills. You have pain that does not get better with medicine. Your incision opens up. Get help right away if: You have trouble breathing. You have chest pain. You have increased pain or swelling in your calf or at the back of your knee. You have numbness and tingling near the knee joint or in the foot, ankle, or toes. You notice that your foot or toes look darker  than normal or are cooler than normal. These symptoms may represent a serious problem that is an emergency. Do not wait to see if the symptoms will go away. Get medical help right away. Call your local emergency services (911 in the [place] Do not drive yourself to the hospital. Summary After the procedure, it is common to have knee pain with bruising and swelling on your knee, calf, and ankle. Icing your knee and raising your leg above the level of your heart will help control the pain and swelling. Do physical therapy exercises as told by your health care provider. Physical therapy will help you regain movement and strength in your knee. This information is not intended to replace advice given to you by your health care provider. Make sure you discuss any questions you have with your health care provider. Document Revised: 08/13/2019 Document Reviewed: 08/13/2019 Elsevier Patient Education  [place] Anesthesia, Adult, Care After This sheet gives you information about how to care for yourself after your procedure. Your health care provider may also give you more specific instructions.  If you have problems or questions, contact your health care provider. What can I expect after the procedure? After the procedure, the following side effects are common: Pain or discomfort at the IV site. Nausea. Vomiting. Sore throat. Trouble concentrating. Feeling cold or chills. Feeling weak or tired. Sleepiness and fatigue. Soreness and body aches. These side effects can affect parts of the body that were not involved in surgery. Follow these instructions at home: For the time period you were told by your health care provider:  Rest. Do not participate in activities where you could fall or become injured. Do not drive or use machinery. Do not drink alcohol. Do not take sleeping pills or medicines that cause drowsiness. Do not make important decisions or sign legal documents. Do not take care of children on your own. Eating and drinking Follow any instructions from your health care provider about eating or drinking restrictions. When you feel hungry, start by eating small amounts of foods that are soft and easy to digest (bland), such as toast. Gradually return to your regular diet. Drink enough fluid to keep your urine pale yellow. If you vomit, rehydrate by drinking water, juice, or clear broth. General instructions If you have sleep apnea, surgery and certain medicines can increase your risk for breathing problems. Follow instructions from your health care provider about wearing your sleep device: Anytime you are sleeping, including during daytime naps. While taking prescription pain medicines, sleeping medicines, or medicines that make you drowsy. Have a responsible adult stay with you for the time you are told. It is important to have someone help care for you until you are awake and alert. Return to your normal activities as told by your health care provider. Ask your health care provider what activities are safe for you. Take over-the-counter and prescription medicines only  as told by your health care provider. If you smoke, do not smoke without supervision. Keep all follow-up visits as told by your health care provider. This is important. Contact a health care provider if: You have nausea or vomiting that does not get better with medicine. You cannot eat or drink without vomiting. You have pain that does not get better with medicine. You are unable to pass urine. You develop a skin rash. You have a fever. You have redness around your IV site that gets worse. Get help right away if: You have difficulty breathing. You have  chest pain. You have blood in your urine or stool, or you vomit blood. Summary After the procedure, it is common to have a sore throat or nausea. It is also common to feel tired. Have a responsible adult stay with you for the time you are told. It is important to have someone help care for you until you are awake and alert. When you feel hungry, start by eating small amounts of foods that are soft and easy to digest (bland), such as toast. Gradually return to your regular diet. Drink enough fluid to keep your urine pale yellow. Return to your normal activities as told by your health care provider. Ask your health care provider what activities are safe for you. This information is not intended to replace advice given to you by your health care provider. Make sure you discuss any questions you have with your health care provider. Document Revised: 11/29/2019 Document Reviewed: 06/28/2019 Elsevier Patient Education  2023 [employer] How to Use Chlorhexidine for Bathing Chlorhexidine gluconate (CHG) is a germ-killing (antiseptic) solution that is used to clean the skin. It can get rid of the bacteria that normally live on the skin and can keep them away for about 24 hours. To clean your skin with CHG, you may be given: A CHG solution to use in the shower or as part of a sponge bath. A prepackaged cloth that contains CHG. Cleaning your skin  with CHG may help lower the risk for infection: While you are staying in the intensive care unit of the hospital. If you have a vascular access, such as a central line, to provide short-term or long-term access to your veins. If you have a catheter to drain urine from your bladder. If you are on a ventilator. A ventilator is a machine that helps you breathe by moving air in and out of your lungs. After surgery. What are the risks? Risks of using CHG include: A skin reaction. Hearing loss, if CHG gets in your ears and you have a perforated eardrum. Eye injury, if CHG gets in your eyes and is not rinsed out. The CHG product catching fire. Make sure that you avoid smoking and flames after applying CHG to your skin. Do not use CHG: If you have a chlorhexidine allergy or have previously reacted to chlorhexidine. On babies younger than 69 months of age. How to use CHG solution Use CHG only as told by your health care provider, and follow the instructions on the label. Use the full amount of CHG as directed. Usually, this is one bottle. During a shower Follow these steps when using CHG solution during a shower (unless your health care provider gives you different instructions): Start the shower. Use your normal soap and shampoo to wash your face and hair. Turn off the shower or move out of the shower stream. Pour the CHG onto a clean washcloth. Do not use any type of brush or rough-edged sponge. Starting at your neck, lather your body down to your toes. Make sure you follow these instructions: If you will be having surgery, pay special attention to the part of your body where you will be having surgery. Scrub this area for at least 1 minute. Do not use CHG on your head or face. If the solution gets into your ears or eyes, rinse them well with water. Avoid your genital area. Avoid any areas of skin that have broken skin, cuts, or scrapes. Scrub your back and under your arms. Make sure to wash  skin folds. Let the lather sit on your skin for 1-2 minutes or as long as told by your health care provider. Thoroughly rinse your entire body in the shower. Make sure that all body creases and crevices are rinsed well. Dry off with a clean towel. Do not put any substances on your body afterward--such as powder, lotion, or perfume--unless you are told to do so by your health care provider. Only use lotions that are recommended by the manufacturer. Put on clean clothes or pajamas. If it is the night before your surgery, sleep in clean sheets.  During a sponge bath Follow these steps when using CHG solution during a sponge bath (unless your health care provider gives you different instructions): Use your normal soap and shampoo to wash your face and hair. Pour the CHG onto a clean washcloth. Starting at your neck, lather your body down to your toes. Make sure you follow these instructions: If you will be having surgery, pay special attention to the part of your body where you will be having surgery. Scrub this area for at least 1 minute. Do not use CHG on your head or face. If the solution gets into your ears or eyes, rinse them well with water. Avoid your genital area. Avoid any areas of skin that have broken skin, cuts, or scrapes. Scrub your back and under your arms. Make sure to wash skin folds. Let the lather sit on your skin for 1-2 minutes or as long as told by your health care provider. Using a different clean, wet washcloth, thoroughly rinse your entire body. Make sure that all body creases and crevices are rinsed well. Dry off with a clean towel. Do not put any substances on your body afterward--such as powder, lotion, or perfume--unless you are told to do so by your health care provider. Only use lotions that are recommended by the manufacturer. Put on clean clothes or pajamas. If it is the night before your surgery, sleep in clean sheets. How to use CHG prepackaged cloths Only use  CHG cloths as told by your health care provider, and follow the instructions on the label. Use the CHG cloth on clean, dry skin. Do not use the CHG cloth on your head or face unless your health care provider tells you to. When washing with the CHG cloth: Avoid your genital area. Avoid any areas of skin that have broken skin, cuts, or scrapes. Before surgery Follow these steps when using a CHG cloth to clean before surgery (unless your health care provider gives you different instructions): Using the CHG cloth, vigorously scrub the part of your body where you will be having surgery. Scrub using a back-and-forth motion for 3 minutes. The area on your body should be completely wet with CHG when you are done scrubbing. Do not rinse. Discard the cloth and let the area air-dry. Do not put any substances on the area afterward, such as powder, lotion, or perfume. Put on clean clothes or pajamas. If it is the night before your surgery, sleep in clean sheets.  For general bathing Follow these steps when using CHG cloths for general bathing (unless your health care provider gives you different instructions). Use a separate CHG cloth for each area of your body. Make sure you wash between any folds of skin and between your fingers and toes. Wash your body in the following order, switching to a new cloth after each step: The front of your neck, shoulders, and chest. Both of your arms, under  your arms, and your hands. Your stomach and groin area, avoiding the genitals. Your right leg and foot. Your left leg and foot. The back of your neck, your back, and your buttocks. Do not rinse. Discard the cloth and let the area air-dry. Do not put any substances on your body afterward--such as powder, lotion, or perfume--unless you are told to do so by your health care provider. Only use lotions that are recommended by the manufacturer. Put on clean clothes or pajamas. Contact a health care provider if: Your skin  gets irritated after scrubbing. You have questions about using your solution or cloth. You swallow any chlorhexidine. Call your local poison control center (1-7607289175 in the [place] Get help right away if: Your eyes itch badly, or they become very red or swollen. Your skin itches badly and is red or swollen. Your hearing changes. You have trouble seeing. You have swelling or tingling in your mouth or throat. You have trouble breathing. These symptoms may represent a serious problem that is an emergency. Do not wait to see if the symptoms will go away. Get medical help right away. Call your local emergency services (911 in the [place] Do not drive yourself to the hospital. Summary Chlorhexidine gluconate (CHG) is a germ-killing (antiseptic) solution that is used to clean the skin. Cleaning your skin with CHG may help to lower your risk for infection. You may be given CHG to use for bathing. It may be in a bottle or in a prepackaged cloth to use on your skin. Carefully follow your health care provider's instructions and the instructions on the product label. Do not use CHG if you have a chlorhexidine allergy. Contact your health care provider if your skin gets irritated after scrubbing. This information is not intended to replace advice given to you by your health care provider. Make sure you discuss any questions you have with your health care provider. Document Revised: 05/26/2020 Document Reviewed: 05/26/2020 Elsevier Patient Education  [place].

## 2021-10-02 NOTE — Pre-Procedure Instructions (Signed)
  RE: CPT Received: Lane Hacker, LPN  Elsie Amis, RN Hi Adellyn Capek,   The procedure should read as:   LEFT KNEE ARTHROSCOPY WITH MEDIAL MENISECTOMY   Let me know if you need anything else.        Previous Messages    ----- Message -----  From: Elsie Amis, RN  Sent: 10/01/2021   3:37 PM EDT  To: Baird Kay, LPN  Subject: CPT                                             Sula Rumple! In Deontay's consent order, it has the CPT code as the procedure and I cannot use that on the consent. What does the consent need to read as?

## 2021-10-05 ENCOUNTER — Encounter (HOSPITAL_COMMUNITY): Payer: Self-pay

## 2021-10-05 ENCOUNTER — Encounter (HOSPITAL_COMMUNITY)
Admission: RE | Admit: 2021-10-05 | Discharge: 2021-10-05 | Disposition: A | Discharge status: Home / Self Care | Payer: Medicare HMO | Source: Ambulatory Visit | Attending: Orthopedic Surgery | Admitting: Orthopedic Surgery

## 2021-10-05 VITALS — BP 125/62 | HR 86 | Temp 97.6°F | Ht 69.0 in | Wt 267.0 lb

## 2021-10-05 DIAGNOSIS — Z01818 Encounter for other preprocedural examination: Secondary | ICD-10-CM | POA: Diagnosis not present

## 2021-10-05 DIAGNOSIS — F17200 Nicotine dependence, unspecified, uncomplicated: Secondary | ICD-10-CM | POA: Diagnosis not present

## 2021-10-05 DIAGNOSIS — Y939 Activity, unspecified: Secondary | ICD-10-CM | POA: Insufficient documentation

## 2021-10-05 DIAGNOSIS — E1140 Type 2 diabetes mellitus with diabetic neuropathy, unspecified: Secondary | ICD-10-CM | POA: Diagnosis not present

## 2021-10-05 DIAGNOSIS — S83232D Complex tear of medial meniscus, current injury, left knee, subsequent encounter: Secondary | ICD-10-CM

## 2021-10-05 HISTORY — DX: Chronic obstructive pulmonary disease, unspecified: J44.9

## 2021-10-05 LAB — BASIC METABOLIC PANEL
Anion gap: 7 (ref 5–15)
BUN: 16 mg/dL (ref 6–20)
CO2: 23 mmol/L (ref 22–32)
Calcium: 8.9 mg/dL (ref 8.9–10.3)
Chloride: 106 mmol/L (ref 98–111)
Creatinine, Ser: 0.94 mg/dL (ref 0.61–1.24)
GFR, Estimated: >60 mL/min (ref >60)
Glucose, Bld: 202 mg/dL — ABNORMAL HIGH (ref 70–99)
Potassium: 3.5 mmol/L (ref 3.5–5.1)
Sodium: 136 mmol/L (ref 135–145)

## 2021-10-05 LAB — CBC WITH DIFFERENTIAL/PLATELET
Abs Immature Granulocytes: 0.03 10*3/uL (ref 0.00–0.07)
Basophils Absolute: 0.1 10*3/uL (ref 0.0–0.1)
Basophils Relative: 1 %
Eosinophils Absolute: 0.1 10*3/uL (ref 0.0–0.5)
Eosinophils Relative: 1 %
HCT: 43 % (ref 39.0–52.0)
Hemoglobin: 14.3 g/dL (ref 13.0–17.0)
Immature Granulocytes: 0 %
Lymphocytes Relative: 27 %
Lymphs Abs: 2.4 10*3/uL (ref 0.7–4.0)
MCH: 28 pg (ref 26.0–34.0)
MCHC: 33.3 g/dL (ref 30.0–36.0)
MCV: 84.1 fL (ref 80.0–100.0)
Monocytes Absolute: 0.9 10*3/uL (ref 0.1–1.0)
Monocytes Relative: 10 %
Neutro Abs: 5.3 10*3/uL (ref 1.7–7.7)
Neutrophils Relative %: 61 %
Platelets: 212 10*3/uL (ref 150–400)
RBC: 5.11 MIL/uL (ref 4.22–5.81)
RDW: 12.7 % (ref 11.5–15.5)
WBC: 8.7 10*3/uL (ref 4.0–10.5)
nRBC: 0 % (ref 0.0–0.2)

## 2021-10-05 LAB — HEMOGLOBIN A1C
Hgb A1c MFr Bld: 6.8 % — ABNORMAL HIGH (ref 4.8–5.6)
Mean Plasma Glucose: 148.46 mg/dL

## 2021-10-05 NOTE — Progress Notes (Signed)
   10/05/21 0959  OBSTRUCTIVE SLEEP APNEA  Have you ever been diagnosed with sleep apnea through a sleep study? No  Do you snore loudly (loud enough to be heard through closed doors)?  1  Do you often feel tired, fatigued, or sleepy during the daytime (such as falling asleep during driving or talking to someone)? 0  Has anyone observed you stop breathing during your sleep? 0  Do you have, or are you being treated for high blood pressure? 1  BMI more than 35 kg/m2? 1  Age > 50 (1-yes) 1  Neck circumference greater than:Male 16 inches or larger, Male 17inches or larger? 0  Male Gender (Yes=1) 1  Obstructive Sleep Apnea Score 5  Score 5 or greater  Results sent to PCP

## 2021-10-08 ENCOUNTER — Encounter (HOSPITAL_COMMUNITY)
Admission: RE | Disposition: A | Discharge status: Home / Self Care | Payer: Self-pay | Source: Home / Self Care | Attending: Orthopedic Surgery

## 2021-10-08 ENCOUNTER — Encounter (HOSPITAL_COMMUNITY): Payer: Self-pay | Admitting: Orthopedic Surgery

## 2021-10-08 ENCOUNTER — Other Ambulatory Visit: Payer: Self-pay

## 2021-10-08 ENCOUNTER — Ambulatory Visit (HOSPITAL_COMMUNITY): Payer: Medicare HMO

## 2021-10-08 ENCOUNTER — Encounter: Payer: Self-pay | Admitting: Orthopedic Surgery

## 2021-10-08 ENCOUNTER — Ambulatory Visit (HOSPITAL_BASED_OUTPATIENT_CLINIC_OR_DEPARTMENT_OTHER): Payer: Medicare HMO

## 2021-10-08 ENCOUNTER — Ambulatory Visit (HOSPITAL_COMMUNITY)
Admission: RE | Admit: 2021-10-08 | Discharge: 2021-10-08 | Disposition: A | Discharge status: Home / Self Care | Payer: Medicare HMO | Attending: Orthopedic Surgery | Admitting: Orthopedic Surgery

## 2021-10-08 DIAGNOSIS — X58XXXA Exposure to other specified factors, initial encounter: Secondary | ICD-10-CM | POA: Insufficient documentation

## 2021-10-08 DIAGNOSIS — E119 Type 2 diabetes mellitus without complications: Secondary | ICD-10-CM | POA: Insufficient documentation

## 2021-10-08 DIAGNOSIS — S83232D Complex tear of medial meniscus, current injury, left knee, subsequent encounter: Secondary | ICD-10-CM | POA: Diagnosis not present

## 2021-10-08 DIAGNOSIS — J449 Chronic obstructive pulmonary disease, unspecified: Secondary | ICD-10-CM | POA: Insufficient documentation

## 2021-10-08 DIAGNOSIS — Z87891 Personal history of nicotine dependence: Secondary | ICD-10-CM

## 2021-10-08 DIAGNOSIS — S83232A Complex tear of medial meniscus, current injury, left knee, initial encounter: Secondary | ICD-10-CM

## 2021-10-08 DIAGNOSIS — Z6839 Body mass index (BMI) 39.0-39.9, adult: Secondary | ICD-10-CM | POA: Insufficient documentation

## 2021-10-08 DIAGNOSIS — E669 Obesity, unspecified: Secondary | ICD-10-CM | POA: Diagnosis not present

## 2021-10-08 HISTORY — PX: KNEE ARTHROSCOPY WITH MEDIAL MENISECTOMY: SHX5651

## 2021-10-08 LAB — GLUCOSE, CAPILLARY: Glucose-Capillary: 146 mg/dL — ABNORMAL HIGH (ref 70–99)

## 2021-10-08 SURGERY — ARTHROSCOPY, KNEE, WITH MEDIAL MENISCECTOMY
Anesthesia: General | Site: Knee | Laterality: Left

## 2021-10-08 MED ORDER — CHLORHEXIDINE GLUCONATE 0.12 % MT SOLN
15.0000 mL | Freq: Once | OROMUCOSAL | Status: AC
  Administered 2021-10-08: 15 mL via OROMUCOSAL

## 2021-10-08 MED ORDER — MIDAZOLAM HCL 5 MG/5ML IJ SOLN
INTRAMUSCULAR | Status: DC | PRN
  Administered 2021-10-08: 2 mg via INTRAVENOUS

## 2021-10-08 MED ORDER — SODIUM CHLORIDE 0.9 % IR SOLN
Status: DC | PRN
  Administered 2021-10-08 (×3): 3000 mL

## 2021-10-08 MED ORDER — MIDAZOLAM HCL 2 MG/2ML IJ SOLN
INTRAMUSCULAR | Status: AC
  Filled 2021-10-08: qty 2

## 2021-10-08 MED ORDER — ONDANSETRON HCL 4 MG PO TABS: 4.0000 mg | ORAL_TABLET | Freq: Three times a day (TID) | ORAL | 0 refills | Status: AC | PRN

## 2021-10-08 MED ORDER — FENTANYL CITRATE (PF) 100 MCG/2ML IJ SOLN
INTRAMUSCULAR | Status: AC
  Filled 2021-10-08: qty 2

## 2021-10-08 MED ORDER — ASPIRIN 81 MG PO TBEC: 81.0000 mg | DELAYED_RELEASE_TABLET | Freq: Two times a day (BID) | ORAL | 0 refills | Status: AC

## 2021-10-08 MED ORDER — PROPOFOL 10 MG/ML IV BOLUS
INTRAVENOUS | Status: AC
  Filled 2021-10-08: qty 20

## 2021-10-08 MED ORDER — BUPIVACAINE-EPINEPHRINE (PF) 0.5% -1:200000 IJ SOLN
INTRAMUSCULAR | Status: AC
  Filled 2021-10-08: qty 30

## 2021-10-08 MED ORDER — HYDROMORPHONE HCL 1 MG/ML IJ SOLN
INTRAMUSCULAR | Status: DC | PRN
  Administered 2021-10-08: .5 mg via INTRAVENOUS

## 2021-10-08 MED ORDER — ONDANSETRON HCL 4 MG/2ML IJ SOLN
INTRAMUSCULAR | Status: AC
  Filled 2021-10-08: qty 2

## 2021-10-08 MED ORDER — HYDROCODONE-ACETAMINOPHEN 7.5-325 MG PO TABS: 1.0000 | ORAL_TABLET | Freq: Once | ORAL | Status: DC | PRN

## 2021-10-08 MED ORDER — HYDROMORPHONE HCL 1 MG/ML IJ SOLN
INTRAMUSCULAR | Status: AC
  Filled 2021-10-08: qty 0.5

## 2021-10-08 MED ORDER — ONDANSETRON HCL 4 MG/2ML IJ SOLN
INTRAMUSCULAR | Status: DC | PRN
  Administered 2021-10-08: 4 mg via INTRAVENOUS

## 2021-10-08 MED ORDER — PROPOFOL 10 MG/ML IV BOLUS
INTRAVENOUS | Status: DC | PRN
  Administered 2021-10-08: 200 mg via INTRAVENOUS

## 2021-10-08 MED ORDER — FENTANYL CITRATE (PF) 100 MCG/2ML IJ SOLN
INTRAMUSCULAR | Status: DC | PRN
  Administered 2021-10-08: 75 ug via INTRAVENOUS
  Administered 2021-10-08 (×4): 50 ug via INTRAVENOUS
  Administered 2021-10-08: 25 ug via INTRAVENOUS

## 2021-10-08 MED ORDER — ORAL CARE MOUTH RINSE: 15.0000 mL | Freq: Once | OROMUCOSAL | Status: AC

## 2021-10-08 MED ORDER — ONDANSETRON HCL 4 MG/2ML IJ SOLN: 4.0000 mg | Freq: Once | INTRAMUSCULAR | Status: DC | PRN

## 2021-10-08 MED ORDER — LIDOCAINE HCL (CARDIAC) PF 100 MG/5ML IV SOSY
PREFILLED_SYRINGE | INTRAVENOUS | Status: DC | PRN
  Administered 2021-10-08: 80 mg via INTRAVENOUS

## 2021-10-08 MED ORDER — CEFAZOLIN IN SODIUM CHLORIDE 3-0.9 GM/100ML-% IV SOLN
3.0000 g | INTRAVENOUS | Status: AC
  Administered 2021-10-08: 3 g via INTRAVENOUS
  Filled 2021-10-08: qty 100

## 2021-10-08 MED ORDER — FENTANYL CITRATE PF 50 MCG/ML IJ SOSY
25.0000 ug | PREFILLED_SYRINGE | INTRAMUSCULAR | Status: DC | PRN
  Administered 2021-10-08: 50 ug via INTRAVENOUS
  Filled 2021-10-08: qty 1

## 2021-10-08 MED ORDER — EPINEPHRINE PF 1 MG/ML IJ SOLN
INTRAMUSCULAR | Status: AC
  Filled 2021-10-08: qty 6

## 2021-10-08 MED ORDER — DEXMEDETOMIDINE (PRECEDEX) IN NS 20 MCG/5ML (4 MCG/ML) IV SYRINGE
PREFILLED_SYRINGE | INTRAVENOUS | Status: DC | PRN
  Administered 2021-10-08 (×2): 10 ug via INTRAVENOUS

## 2021-10-08 MED ORDER — DEXAMETHASONE SODIUM PHOSPHATE 10 MG/ML IJ SOLN
INTRAMUSCULAR | Status: AC
  Filled 2021-10-08: qty 1

## 2021-10-08 MED ORDER — BUPIVACAINE-EPINEPHRINE (PF) 0.5% -1:200000 IJ SOLN
INTRAMUSCULAR | Status: DC | PRN
  Administered 2021-10-08: 30 mL via PERINEURAL

## 2021-10-08 MED ORDER — LACTATED RINGERS IV SOLN: INTRAVENOUS | Status: DC

## 2021-10-08 MED ORDER — SODIUM CHLORIDE 0.9 % IR SOLN: Status: DC | PRN

## 2021-10-08 SURGICAL SUPPLY — 45 items
APL PRP STRL LF DISP 70% ISPRP (MISCELLANEOUS) ×2
BAG HAMPER (MISCELLANEOUS) ×3 IMPLANT
BNDG CMPR STD VLCR NS LF 5.8X6 (GAUZE/BANDAGES/DRESSINGS) ×2
BNDG ELASTIC 6X5.8 VLCR NS LF (GAUZE/BANDAGES/DRESSINGS) ×6 IMPLANT
CHLORAPREP W/TINT 26 (MISCELLANEOUS) ×4 IMPLANT
CLOTH BEACON ORANGE TIMEOUT ST (SAFETY) ×3 IMPLANT
COOLER ICEMAN CLASSIC (MISCELLANEOUS) ×3 IMPLANT
CUFF TOURN SGL QUICK 34 (TOURNIQUET CUFF) ×2
CUFF TRNQT CYL 34X4.125X (TOURNIQUET CUFF) ×2 IMPLANT
CUTTER BONE 4.0MM X 13CM (MISCELLANEOUS) ×1 IMPLANT
DECANTER SPIKE VIAL GLASS SM (MISCELLANEOUS) ×3 IMPLANT
GAUZE SPONGE 4X4 12PLY STRL (GAUZE/BANDAGES/DRESSINGS) ×3 IMPLANT
GAUZE XEROFORM 1X8 LF (GAUZE/BANDAGES/DRESSINGS) ×1 IMPLANT
GLOVE BIOGEL PI IND STRL 7.0 (GLOVE) ×4 IMPLANT
GLOVE BIOGEL PI IND STRL 8 (GLOVE) IMPLANT
GLOVE BIOGEL PI INDICATOR 7.0 (GLOVE) ×2
GLOVE BIOGEL PI INDICATOR 8 (GLOVE) ×1
GLOVE ECLIPSE 7.0 STRL STRAW (GLOVE) ×1 IMPLANT
GLOVE SKINSENSE NS SZ8.0 LF (GLOVE) ×1
GLOVE SKINSENSE STRL SZ8.0 LF (GLOVE) IMPLANT
GOWN STRL REUS W/ TWL XL LVL3 (GOWN DISPOSABLE) ×2 IMPLANT
GOWN STRL REUS W/TWL LRG LVL3 (GOWN DISPOSABLE) ×3 IMPLANT
GOWN STRL REUS W/TWL XL LVL3 (GOWN DISPOSABLE) ×2
IV NS IRRIG 3000ML ARTHROMATIC (IV SOLUTION) ×7 IMPLANT
KIT BLADEGUARD II DBL (SET/KITS/TRAYS/PACK) ×3 IMPLANT
KIT TURNOVER CYSTO (KITS) ×3 IMPLANT
MANIFOLD NEPTUNE II (INSTRUMENTS) ×3 IMPLANT
MARKER SKIN DUAL TIP RULER LAB (MISCELLANEOUS) ×3 IMPLANT
NDL HYPO 21X1.5 SAFETY (NEEDLE) ×2 IMPLANT
NDL SPNL 18GX3.5 QUINCKE PK (NEEDLE) ×2 IMPLANT
NEEDLE HYPO 21X1.5 SAFETY (NEEDLE) ×2 IMPLANT
NEEDLE SPNL 18GX3.5 QUINCKE PK (NEEDLE) ×2 IMPLANT
PACK ARTHRO LIMB DRAPE STRL (MISCELLANEOUS) ×3 IMPLANT
PAD ABD 5X9 TENDERSORB (GAUZE/BANDAGES/DRESSINGS) ×4 IMPLANT
PAD ARMBOARD 7.5X6 YLW CONV (MISCELLANEOUS) ×3 IMPLANT
PAD COLD SHLDR WRAP-ON (PAD) ×3 IMPLANT
PAD FOR LEG HOLDER (MISCELLANEOUS) ×3 IMPLANT
PADDING CAST COTTON 6X4 STRL (CAST SUPPLIES) ×3 IMPLANT
PORT APPOLLO RF 90DEGREE MULTI (SURGICAL WAND) ×1 IMPLANT
SET ARTHROSCOPY INST (INSTRUMENTS) ×3 IMPLANT
SET BASIN LINEN APH (SET/KITS/TRAYS/PACK) ×3 IMPLANT
SUT ETHILON 3 0 FSL (SUTURE) ×3 IMPLANT
SYR 10ML LL (SYRINGE) ×3 IMPLANT
SYR 30ML LL (SYRINGE) ×5 IMPLANT
TUBING IN/OUT FLOW W/MAIN PUMP (TUBING) ×3 IMPLANT

## 2021-10-08 NOTE — Op Note (Signed)
Orthopaedic Surgery Operative Note (CSN: [id number])  Tyrone Peterson  May 22, 1965 Date of Surgery: 10/08/2021   Diagnoses:  COMPLEX TEAR OF MEDIAL MENISCUS OF LEFT KNEE  Procedure: Left knee arthroscopy, partial medial meniscectomy   Operative Finding Exam under anesthesia: Range of motion from 10-100 degrees.  No increased laxity varus valgus stress.  Negative Lachman. Suprapatellar pouch: No loose bodies. Patellofemoral Compartment: Large areas of full-thickness cartilage loss on both the trochlea, as well as the undersurface of the patella. Medial Compartment: Grade 3 changes in the medial femoral condyle.  There is a complex tear at the junction of the posterior horn, and the body of the medial meniscus. Lateral Compartment: Grade 3 changes in the lateral femoral condyle, with fraying on both the plateau, as well as the condyle.  No meniscus injury.  Some fraying at the meniscal root. Intercondylar Notch: ACL intact.  Successful completion of the planned procedure.  Gentle debridement of the fat pad, with partial medial meniscectomy at the junction of the posterior horn, and the body.   Post-Op Diagnosis: Same Surgeons:Primary: Tyrone Barre, MD Location: [facility] OR ROOM 4 Anesthesia: General with local anesthetic at the portal sites. Antibiotics: Ancef 3 g Tourniquet time:  Total Tourniquet Time Documented: Thigh (Left) - 36 minutes Total: Thigh (Left) - 36 minutes  Estimated Blood Loss: Minimal Complications: None Specimens: None Implants: No implants  Indications for Surgery:   Tyrone Peterson is a 56 y.o. male with refractory left knee pain.  He has tried nonoperative management including medications, injections, therapy as well as using a cane to assist with ambulation.  He continues to have pain.  MRI demonstrated some areas of full-thickness cartilage loss, but also with a complex tear of the medial meniscus.  Benefits and risks of operative and nonoperative  management were discussed prior to surgery with the patient and informed consent form was completed.  Specific risks including infection, bleeding, persistent pain, blood clots need for additional surgery, and more severe complications associated with anesthesia.  Elected to proceed.  Surgical consent was finalized.   Procedure:   The patient was identified properly. Informed consent was obtained and the surgical site was marked. The patient was taken up to suite where general anesthesia was induced. The patient was placed in the supine position with a post against the surgical leg and a nonsterile tourniquet applied. The surgical leg was then prepped and draped usual sterile fashion.  A standard surgical timeout was performed.  2 standard anterior portals were made and diagnostic arthroscopy performed. Please note the findings as noted above.  The shaver was introduced and the ligamentum mucosum was taken down.  The ACL was visualized, deemed to be intact.  We then evaluated the patellofemoral compartment, and there was full-thickness cartilage loss on the trochlea, as well as the undersurface of the patella.  We then proceeded to evaluate the medial compartment.  There was grade 3 changes, with some small areas of cartilage that we could probe to bone.  Within the compartment, there was a complex tear at the junction of the posterior horn and meniscus body.  Using combination of shaver and arthroscopic biter, this was debrided.  At the junction, at least 50% of the meniscus was debrided.  There was a stable rim remaining.  The meniscus was intact at the posterior root.  There were some small flaps on the medial femoral condyle which were gently debrided.  We then turned our attention to the lateral compartment.  There was  some grade 2/3 changes, with some mild fraying of both the medial lateral femoral condyle, as well as the tibial plateau.  This was gently debrided with a shaver.  There was also some  fraying at the posterior root.  This was gently debrided with a combination of shaver and electrocautery.  We then evaluated the rest the knee, and there was no additional pathology to address.  No loose bodies were identified.  Hemostasis was maintained with arthroscopic electrocautery.  The instruments were then removed.  Incisions closed with 3-0 nylon, followed by a sterile dressing and an ice machine. The patient was awoken from general anesthesia and taken to the PACU in stable condition without complication.     Post-operative plan:  The patient will be weightbearing as tolerated, range of motion as tolerated The patient will be discharged from the PACU once he has recovered. DVT prophylaxis Aspirin 81 mg twice daily for 6 weeks.   Pain control with PRN pain medication preferring oral medicines.   Follow up plan will be scheduled in approximately 10-14 days for incision check and XR.

## 2021-10-08 NOTE — Anesthesia Procedure Notes (Addendum)
Procedure Name: LMA Insertion Date/Time: 10/08/2021 12:37 PM  Performed by: Bobbe Medico, RNPre-anesthesia Checklist: Patient identified, Emergency Drugs available, Suction available, Patient being monitored and Timeout performed Patient Re-evaluated:Patient Re-evaluated prior to induction Oxygen Delivery Method: Circle system utilized Preoxygenation: Pre-oxygenation with 100% oxygen Induction Type: IV induction LMA: LMA inserted LMA Size: 4.0 Number of attempts: 1 Tube secured with: Tape Dental Injury: Teeth and Oropharynx as per pre-operative assessment  Comments: Easy atraumatic LMA insertion, no mask ventilation attempted

## 2021-10-08 NOTE — Interval H&P Note (Signed)
History and Physical Interval Note:  10/08/2021 12:07 PM  Tyrone Peterson  has presented today for surgery, with the diagnosis of COMPLEX TEAR OF MEDIAL MENISCUS OF LEFT KNEE.  The various methods of treatment have been discussed with the patient and family. After consideration of risks, benefits and other options for treatment, the patient has consented to  Procedure(s): LEFT KNEE ARTHROSCOPY WITH MEDIAL MENISECTOMY (Left) as a surgical intervention.  The patient's history has been reviewed, patient examined, no change in status, stable for surgery.  I have reviewed the patient's chart and labs.  Questions were answered to the patient's satisfaction.     Patient has exhausted nonoperative management of his knee pain and would like to proceed with knee arthroscopy.  All questions were answered  Oliver Barre

## 2021-10-08 NOTE — Transfer of Care (Signed)
Immediate Anesthesia Transfer of Care Note  Patient: Tyrone Peterson  Procedure(s) Performed: LEFT KNEE ARTHROSCOPY WITH MEDIAL MENISECTOMY (Left: Knee)  Patient Location: PACU  Anesthesia Type:General  Level of Consciousness: awake, alert  and oriented  Airway & Oxygen Therapy: Patient Spontanous Breathing and Patient connected to nasal cannula oxygen  Post-op Assessment: Report given to RN and Post -op Vital signs reviewed and stable  Post vital signs: Reviewed and stable  Last Vitals:  Vitals Value Taken Time  BP 152/90   Temp    Pulse 89 10/08/21 1346  Resp 10 10/08/21 1346  SpO2 100 % 10/08/21 1346  Vitals shown include unvalidated device data.  Last Pain:  Vitals:   10/08/21 0826  TempSrc: Oral  PainSc: 8       Patients Stated Pain Goal: 9 (10/08/21 0826)  Complications: No notable events documented.

## 2021-10-08 NOTE — H&P (Signed)
Below is the most recent clinic note for Tyrone Peterson; any pertinent information regarding their recent medical history will be updated on the day of surgery.     Orthopaedic Clinic Return - Virtual Telephone Visit   **Visit was conducted via telephone at the patient's request.  No vital signs or physical exam were completed.  All previous medical records were readily available during my conversation with the patient.  In total, I was on the phone with the patient for 15 minutes**   Location: Patient: Home Provider: Clinic   [phone] - 11-20 minutes    Assessment: Tyrone Peterson is a 56 y.o. male with the following: Left knee pain, with complex tear of the medial meniscus   Plan: Patient continues to have pain in the left knee.  He has tried injections, medications and physical therapy.  He has not achieved sustained relief.  We have previously discussed proceeding with a left knee arthroscopy, and he is interested in pursuing this.  We had extensive discussion today regarding the plan for surgery.  He does have some cartilage wear within the left knee, as demonstrated on the MRI.  As a result, he may have incomplete resolution of the pain in his left knee.  He is aware.  He states his understanding.  He is willing to accept this potential risk.  He is interested in pursuing left knee arthroscopy.  He will need to obtain medical clearance prior to scheduling a surgical date.  We will reach out to his primary care provider, in order to get the appropriate clearance.  I have also asked him to contact his doctor in order to facilitate medical clearance.     Follow-up: Return for After medical clearance for OR.     Subjective:       Chief Complaint  Patient presents with   Follow-up      Left knee pain      History of Present Illness: I connected with  [name] on 07/22/21 via telephone and verified that I am speaking with the correct person using two  identifiers.   I discussed the limitations of evaluation and management by telemedicine. The patient expressed understanding and agreed to proceed.     Tyrone Peterson is a 56 y.o. male who continues to have left knee pain.  He has tried injections.  He is work with physical therapy.  His symptoms have improved.  However, he feels the symptoms progressively worsening, as the steroid injection potentially wears off.  At the last visit, we discussed proceeding with hyaluronic acid injections.  However, he would like to forego these injections at this time.  He is more interested in scheduling surgery.   Review of Systems: No fevers or chills No numbness or tingling No chest pain No shortness of breath No bowel or bladder dysfunction No GI distress No headaches     Objective: No vital signs   Physical Exam:   Telephone consult - No exam   IMAGING: I personally ordered and reviewed the following images:  No new imaging today

## 2021-10-08 NOTE — Discharge Instructions (Signed)
Chenae Brager A. Dallas Schimke, MD MS [facility] [address] [place],  [place]  [address] Phone: [phone] Fax:  [phone]    POST-OPERATIVE INSTRUCTIONS - Knee Arthroscopy  WOUND CARE - You may remove the Operative Dressing on Post-Op Day #3 (72hrs after surgery).  Alternatively if you would like you can leave dressing on until follow-up if within 7-8 days but keep it dry. - Leave steri-strips in place until they fall off on their own, usually 2 weeks postop. - An ACE wrap may be used to control swelling, do not wrap this too tight.  If the initial ACE wrap feels too tight you may loosen it. - There may be a small amount of fluid/bleeding leaking at the surgical site. This is normal; the knee is filled with fluid during the procedure and can leak for 24-48hrs after surgery. You may change/reinforce the bandage as needed.  - Use the Cryocuff or Ice as often as possible for the first 7 days, then as needed for pain relief. Always keep a towel, ACE wrap or other barrier between the cooling unit and your skin.  - You may shower on Post-Op Day #3. Gently pat the area dry. Do not soak the knee in water or submerge it. Do not go swimming in the pool or ocean until 4 weeks after surgery or when otherwise instructed.  Keep dry incisions as dry as possible.   BRACE/AMBULATION -  You can use your brace until your nerve block wears off.  At that time you can remove it.  If applicable -            Unless otherwise specified, you will not need a brace after this procedure.    REGIONAL ANESTHESIA (NERVE BLOCKS) - The anesthesia team may have performed a nerve block for you if safe in the setting of your care.  This is a great tool used to minimize pain.  Typically the block may start wearing off overnight.  This can be a challenging period but please utilize your as needed pain medications to try and manage this period and know it will be a brief transition as the nerve block wears  completely   POST-OP MEDICATIONS - Multimodal approach to pain control - In general your pain will be controlled with a combination of substances.  Prescriptions unless otherwise discussed are electronically sent to your pharmacy.  This is a carefully made plan we use to minimize narcotic use.    - Celebrex - Anti-inflammatory medication taken on a scheduled basis; continue taking this medication - Acetaminophen - Non-narcotic pain medicine; can help limit the amount of narcotic needed - Oxycodone - This is a strong narcotic, to be used only on an as needed basis for pain.  No additional prescription provided at this time - Aspirin 81mg  - This medicine is used to minimize the risk of blood clots after surgery. -  Zofran - take as needed for nausea  FOLLOW-UP   Please call the office to schedule a follow-up appointment for your incision check, 7-10 days post-operatively.   IF YOU HAVE ANY QUESTIONS, PLEASE FEEL FREE TO CALL OUR OFFICE.   HELPFUL INFORMATION  - If you had a block, it will wear off between 8-24 hrs postop typically.  This is period when your pain may go from nearly zero to the pain you would have had post-op without the block.  This is an abrupt transition but nothing dangerous is happening.  You may take  an extra dose of narcotic when this happens.   Keep your leg elevated to decrease swelling, which will then in turn decrease your pain. I would elevate the foot of your bed by putting a couple of couch pillows between your mattress and box spring. I would not keep pillow directly under your ankle.  - Do not sleep with a pillow behind your knee even if it is more comfortable as this may make it harder to get your knee fully straight long term.   There will be MORE swelling on days 1-3 than there is on the day of surgery.  This also is normal. The swelling will decrease with the anti-inflammatory medication, ice and keeping it elevated. The swelling will make it more  difficult to bend your knee. As the swelling goes down your motion will become easier   You may develop swelling and bruising that extends from your knee down to your calf and perhaps even to your foot over the next week. Do not be alarmed. This too is normal, and it is due to gravity   There may be some numbness adjacent to the incision site. This may last for 6-12 months or longer in some patients and is expected.   You may return to sedentary work/school in the next couple of days when you feel up to it. You will need to keep your leg elevated as much as possible    You should wean off your narcotic medicines as soon as you are able.  Most patients will be off or using minimal narcotics before their first postop appointment.    We suggest you use the pain medication the first night prior to going to bed, in order to ease any pain when the anesthesia wears off. You should avoid taking pain medications on an empty stomach as it will make you nauseous.   Do not drink alcoholic beverages or take illicit drugs when taking pain medications.   It is against the law to drive while taking narcotics. You cannot drive if your Right leg is in brace locked in extension.   Pain medication may make you constipated.  Below are a few solutions to try in this order:  o Decrease the amount of pain medication if you aren't having pain.  o Drink lots of decaffeinated fluids.  o Drink prune juice and/or each dried prunes   o If the first 3 don't work start with additional solutions  o Take Colace - an over-the-counter stool softener  o Take Senokot - an over-the-counter laxative  o Take Miralax - a stronger over-the-counter laxative

## 2021-10-08 NOTE — Anesthesia Preprocedure Evaluation (Signed)
Anesthesia Evaluation    Reviewed: Allergy & Precautions, Patient's Chart, lab work & pertinent test results  Airway Mallampati: II  TM Distance: >3 FB Neck ROM: Full    Dental  (+) Upper Dentures   Pulmonary COPD, former smoker,    Pulmonary exam normal        Cardiovascular negative cardio ROS Normal cardiovascular exam     Neuro/Psych negative neurological ROS     GI/Hepatic negative GI ROS, Neg liver ROS,   Endo/Other  negative endocrine ROSdiabetes  Renal/GU negative Renal ROS     Musculoskeletal negative musculoskeletal ROS (+)   Abdominal (+) + obese,   Peds  Hematology negative hematology ROS (+)   Anesthesia Other Findings   Reproductive/Obstetrics                             Anesthesia Physical Anesthesia Plan  ASA: 3  Anesthesia Plan: General   Post-op Pain Management:    Induction: Intravenous  PONV Risk Score and Plan: 2  Airway Management Planned: Oral ETT and LMA  Additional Equipment:   Intra-op Plan:   Post-operative Plan:   Informed Consent: I have reviewed the patients History and Physical, chart, labs and discussed the procedure including the risks, benefits and alternatives for the proposed anesthesia with the patient or authorized representative who has indicated his/her understanding and acceptance.     Dental advisory given  Plan Discussed with: CRNA  Anesthesia Plan Comments:         Anesthesia Quick Evaluation

## 2021-10-09 ENCOUNTER — Encounter (HOSPITAL_COMMUNITY): Payer: Self-pay | Admitting: Orthopedic Surgery

## 2021-10-09 NOTE — Anesthesia Postprocedure Evaluation (Signed)
Anesthesia Post Note  Patient: Tyrone Peterson  Procedure(s) Performed: LEFT KNEE ARTHROSCOPY WITH MEDIAL MENISECTOMY (Left: Knee)  Patient location during evaluation: PACU Anesthesia Type: General Level of consciousness: awake and alert Pain management: pain level controlled Vital Signs Assessment: post-procedure vital signs reviewed and stable Respiratory status: spontaneous breathing, nonlabored ventilation, respiratory function stable and patient connected to nasal cannula oxygen Cardiovascular status: blood pressure returned to baseline and stable Postop Assessment: no apparent nausea or vomiting Anesthetic complications: no   There were no known notable events for this encounter.   Last Vitals:  Vitals:   10/08/21 1415 10/08/21 1434  BP: (!) 142/86 (!) 155/82  Pulse: 85 80  Resp: 14 16  Temp:  36.7 C  SpO2: 100% 100%    Last Pain:  Vitals:   10/09/21 1049  TempSrc:   PainSc: 7                  Glynis Smiles

## 2021-10-12 ENCOUNTER — Other Ambulatory Visit: Payer: Self-pay | Admitting: Orthopedic Surgery

## 2021-10-23 ENCOUNTER — Encounter: Payer: Self-pay | Admitting: Orthopedic Surgery

## 2021-10-23 ENCOUNTER — Ambulatory Visit (INDEPENDENT_AMBULATORY_CARE_PROVIDER_SITE_OTHER): Payer: Medicare HMO | Admitting: Orthopedic Surgery

## 2021-10-23 VITALS — Ht 69.0 in | Wt 267.0 lb

## 2021-10-23 DIAGNOSIS — S83232D Complex tear of medial meniscus, current injury, left knee, subsequent encounter: Secondary | ICD-10-CM

## 2021-10-24 ENCOUNTER — Encounter: Payer: Self-pay | Admitting: Orthopedic Surgery

## 2021-10-24 NOTE — Progress Notes (Signed)
Orthopaedic Postop Note  Assessment: Tyrone Peterson is a 56 y.o. male s/p left knee arthroscopy, partial medial meniscectomy  DOS: 10/08/2021  Plan: Sutures were removed, Steri-Strips placed. Procedure was discussed Anticipated recovery outlined. Placed a referral for physical therapy. Follow-up in 4 weeks  Follow-up: Return in about 4 weeks (around 11/20/2021). XR at next visit: None  Subjective:  Chief Complaint  Patient presents with   Routine Post Op    DOS 10/08/21    History of Present Illness: Tyrone Peterson is a 56 y.o. male who presents following the above stated procedure.  Surgery was 2 weeks ago.  He has done well.  He is walking without assistive device.  He states his pain is better.  He has regained full range of motion.  No issues with the surgical incisions.  Review of Systems: No fevers or chills No numbness or tingling No Chest Pain No shortness of breath   Objective: Ht 5\' 9"  (1.753 m)   Wt 267 lb (121.1 kg)   BMI 39.43 kg/m   Physical Exam:  Alert and oriented.  No acute distress.  Left knee with mild effusion.  Surgical incisions are healing well.  No surrounding erythema or drainage.  Small amount of scabbing at the 6 incision sites.  Tolerates extension to just short of neutral.  Tolerates flexion beyond 100 degrees.  No increased laxity varus or valgus stress.  IMAGING: I personally ordered and reviewed the following images:  No new imaging obtained today.  , MD 10/24/2021 10:38 PM

## 2021-10-26 DIAGNOSIS — E782 Mixed hyperlipidemia: Secondary | ICD-10-CM | POA: Diagnosis not present

## 2021-10-26 DIAGNOSIS — I10 Essential (primary) hypertension: Secondary | ICD-10-CM | POA: Diagnosis not present

## 2021-10-26 DIAGNOSIS — E1165 Type 2 diabetes mellitus with hyperglycemia: Secondary | ICD-10-CM | POA: Diagnosis not present

## 2021-11-20 ENCOUNTER — Encounter: Payer: Medicare HMO | Admitting: Orthopedic Surgery

## 2021-11-25 ENCOUNTER — Encounter: Payer: Self-pay | Admitting: Orthopedic Surgery

## 2021-11-25 ENCOUNTER — Ambulatory Visit (INDEPENDENT_AMBULATORY_CARE_PROVIDER_SITE_OTHER): Payer: Medicare HMO | Admitting: Orthopedic Surgery

## 2021-11-25 DIAGNOSIS — S83232D Complex tear of medial meniscus, current injury, left knee, subsequent encounter: Secondary | ICD-10-CM

## 2021-11-25 NOTE — Progress Notes (Signed)
Orthopaedic Postop Note  Assessment: Tyrone Peterson is a 56 y.o. male s/p left knee arthroscopy, partial medial meniscectomy  DOS: 10/08/2021  Plan: Tyrone Peterson has been doing great.  He continues to work on his strength and endurance.  He is no longer using a cane.  He is not using a brace on his left knee.  He has no complaints at this time.  He is pleased with his results.  I advised him to continue working on his strength, range of motion and endurance.  He states understanding.  Follow-up as needed.  Follow-up: No follow-ups on file. XR at next visit: None  Subjective:  Chief Complaint  Patient presents with   Post-op Follow-up    Left knee 10/08/21     History of Present Illness: Tyrone Peterson is a 56 y.o. male who presents following the above stated procedure.  Surgery was 6 weeks ago.  He is doing well.  His pain is much better.  He has better range of motion.  He is able to ambulate without assistive device.  He is no longer using a brace or compression sleeve on his left knee.  No issues with his incisions.  He is happy with his results.   Review of Systems: No fevers or chills No numbness or tingling No Chest Pain No shortness of breath   Objective: There were no vitals taken for this visit.  Physical Exam:  Alert and oriented.  No acute distress.  Incisions are healing well.  No surrounding erythema or drainage.  Range of motion from 3-110 degrees without pain.  No tenderness to palpation on the medial or lateral joint line.  He walks with a mildly antalgic gait, without assistive device.  IMAGING: I personally ordered and reviewed the following images:  No new imaging obtained today.  Oliver Barre, MD 11/25/2021 2:42 PM

## 2021-11-26 DIAGNOSIS — I10 Essential (primary) hypertension: Secondary | ICD-10-CM | POA: Diagnosis not present

## 2021-11-26 DIAGNOSIS — E782 Mixed hyperlipidemia: Secondary | ICD-10-CM | POA: Diagnosis not present

## 2021-11-26 DIAGNOSIS — E1165 Type 2 diabetes mellitus with hyperglycemia: Secondary | ICD-10-CM | POA: Diagnosis not present

## 2021-12-26 DIAGNOSIS — E1165 Type 2 diabetes mellitus with hyperglycemia: Secondary | ICD-10-CM | POA: Diagnosis not present

## 2021-12-26 DIAGNOSIS — I10 Essential (primary) hypertension: Secondary | ICD-10-CM | POA: Diagnosis not present

## 2021-12-26 DIAGNOSIS — E782 Mixed hyperlipidemia: Secondary | ICD-10-CM | POA: Diagnosis not present

## 2022-02-18 LAB — COLOGUARD

## 2022-03-02 DIAGNOSIS — I10 Essential (primary) hypertension: Secondary | ICD-10-CM | POA: Diagnosis not present

## 2022-03-02 DIAGNOSIS — E785 Hyperlipidemia, unspecified: Secondary | ICD-10-CM | POA: Diagnosis not present

## 2022-03-02 DIAGNOSIS — E559 Vitamin D deficiency, unspecified: Secondary | ICD-10-CM | POA: Diagnosis not present

## 2022-03-02 DIAGNOSIS — E782 Mixed hyperlipidemia: Secondary | ICD-10-CM | POA: Diagnosis not present

## 2022-03-02 DIAGNOSIS — E119 Type 2 diabetes mellitus without complications: Secondary | ICD-10-CM | POA: Diagnosis not present

## 2022-03-03 DIAGNOSIS — Z1211 Encounter for screening for malignant neoplasm of colon: Secondary | ICD-10-CM | POA: Diagnosis not present

## 2022-03-09 DIAGNOSIS — E119 Type 2 diabetes mellitus without complications: Secondary | ICD-10-CM | POA: Diagnosis not present

## 2022-03-09 DIAGNOSIS — G4700 Insomnia, unspecified: Secondary | ICD-10-CM | POA: Diagnosis not present

## 2022-03-09 DIAGNOSIS — G8929 Other chronic pain: Secondary | ICD-10-CM | POA: Diagnosis not present

## 2022-03-09 DIAGNOSIS — R000 Tachycardia, unspecified: Secondary | ICD-10-CM | POA: Diagnosis not present

## 2022-03-09 DIAGNOSIS — I10 Essential (primary) hypertension: Secondary | ICD-10-CM | POA: Diagnosis not present

## 2022-03-09 DIAGNOSIS — E782 Mixed hyperlipidemia: Secondary | ICD-10-CM | POA: Diagnosis not present

## 2022-03-09 DIAGNOSIS — E785 Hyperlipidemia, unspecified: Secondary | ICD-10-CM | POA: Diagnosis not present

## 2022-03-09 DIAGNOSIS — E559 Vitamin D deficiency, unspecified: Secondary | ICD-10-CM | POA: Diagnosis not present

## 2022-03-09 DIAGNOSIS — N1831 Chronic kidney disease, stage 3a: Secondary | ICD-10-CM | POA: Diagnosis not present

## 2022-03-11 LAB — COLOGUARD: COLOGUARD: NEGATIVE

## 2022-05-27 ENCOUNTER — Encounter: Payer: Self-pay | Admitting: Radiology

## 2022-06-03 IMAGING — DX DG KNEE 3 VIEWS*L*
3 series · 3 of 3 positions shown · non-contrast
Comparison: None.

CLINICAL DATA: Fell last week, anteromedial pain

EXAM:
LEFT KNEE - 3 VIEW

[knee ap]
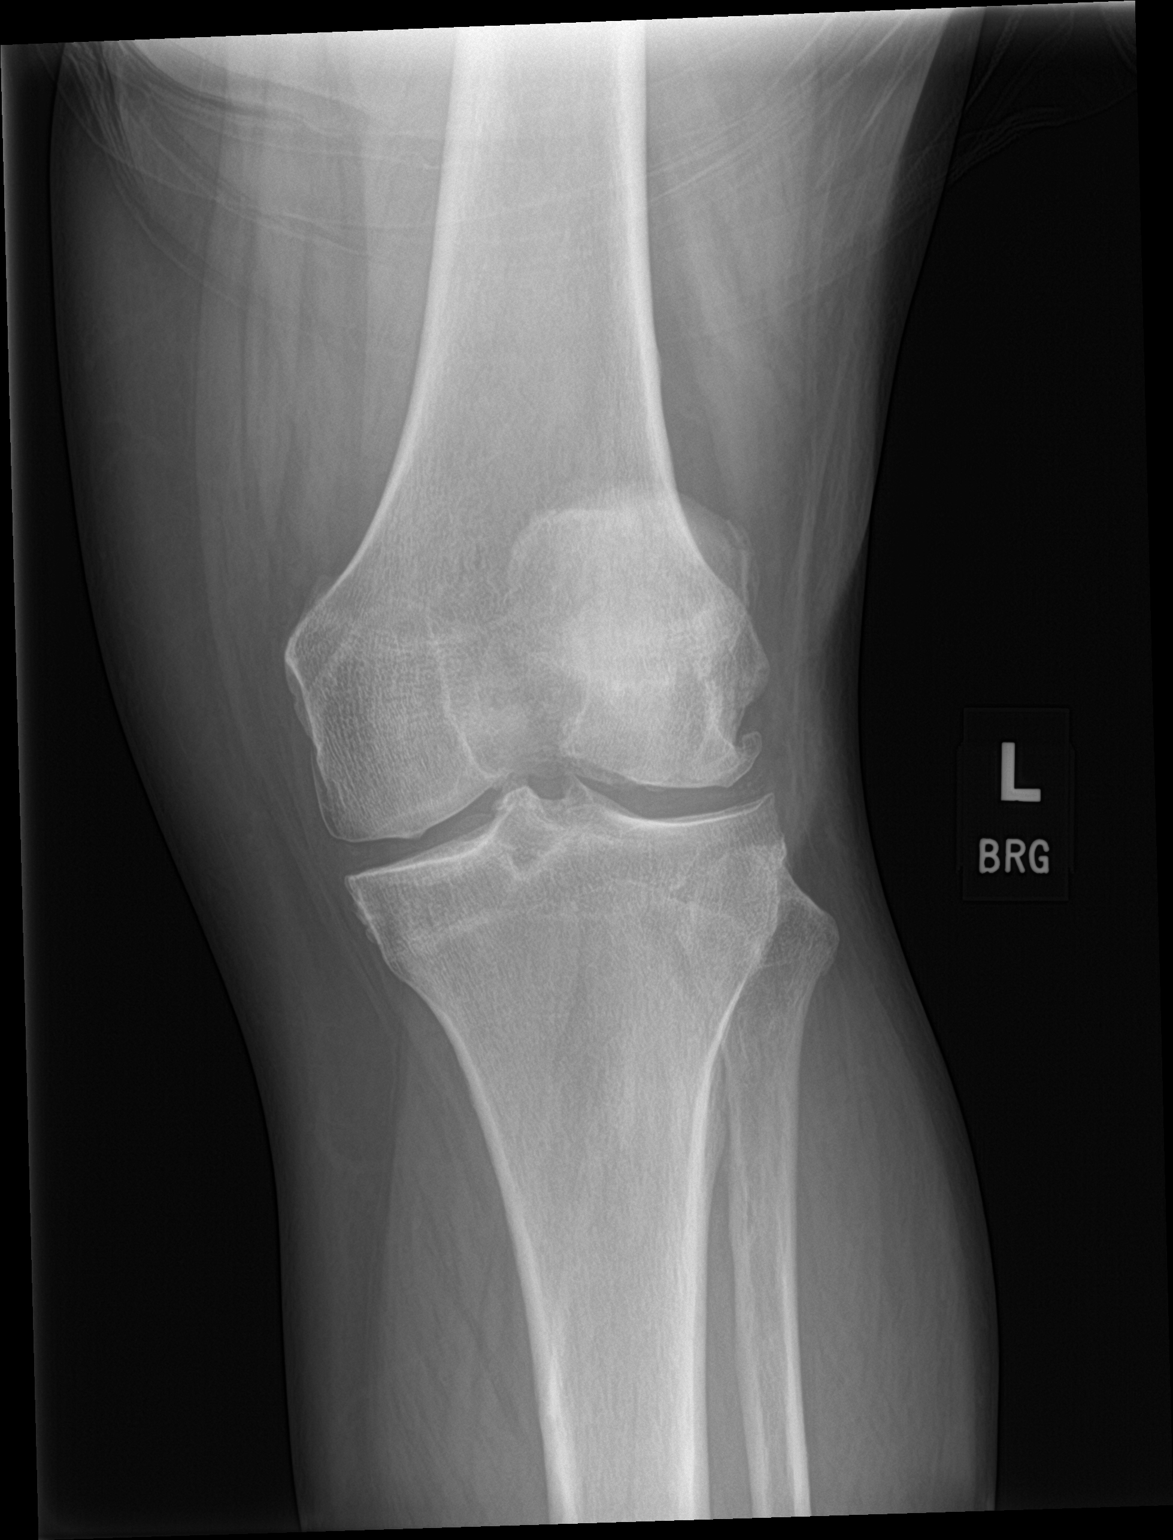

[knee lat]
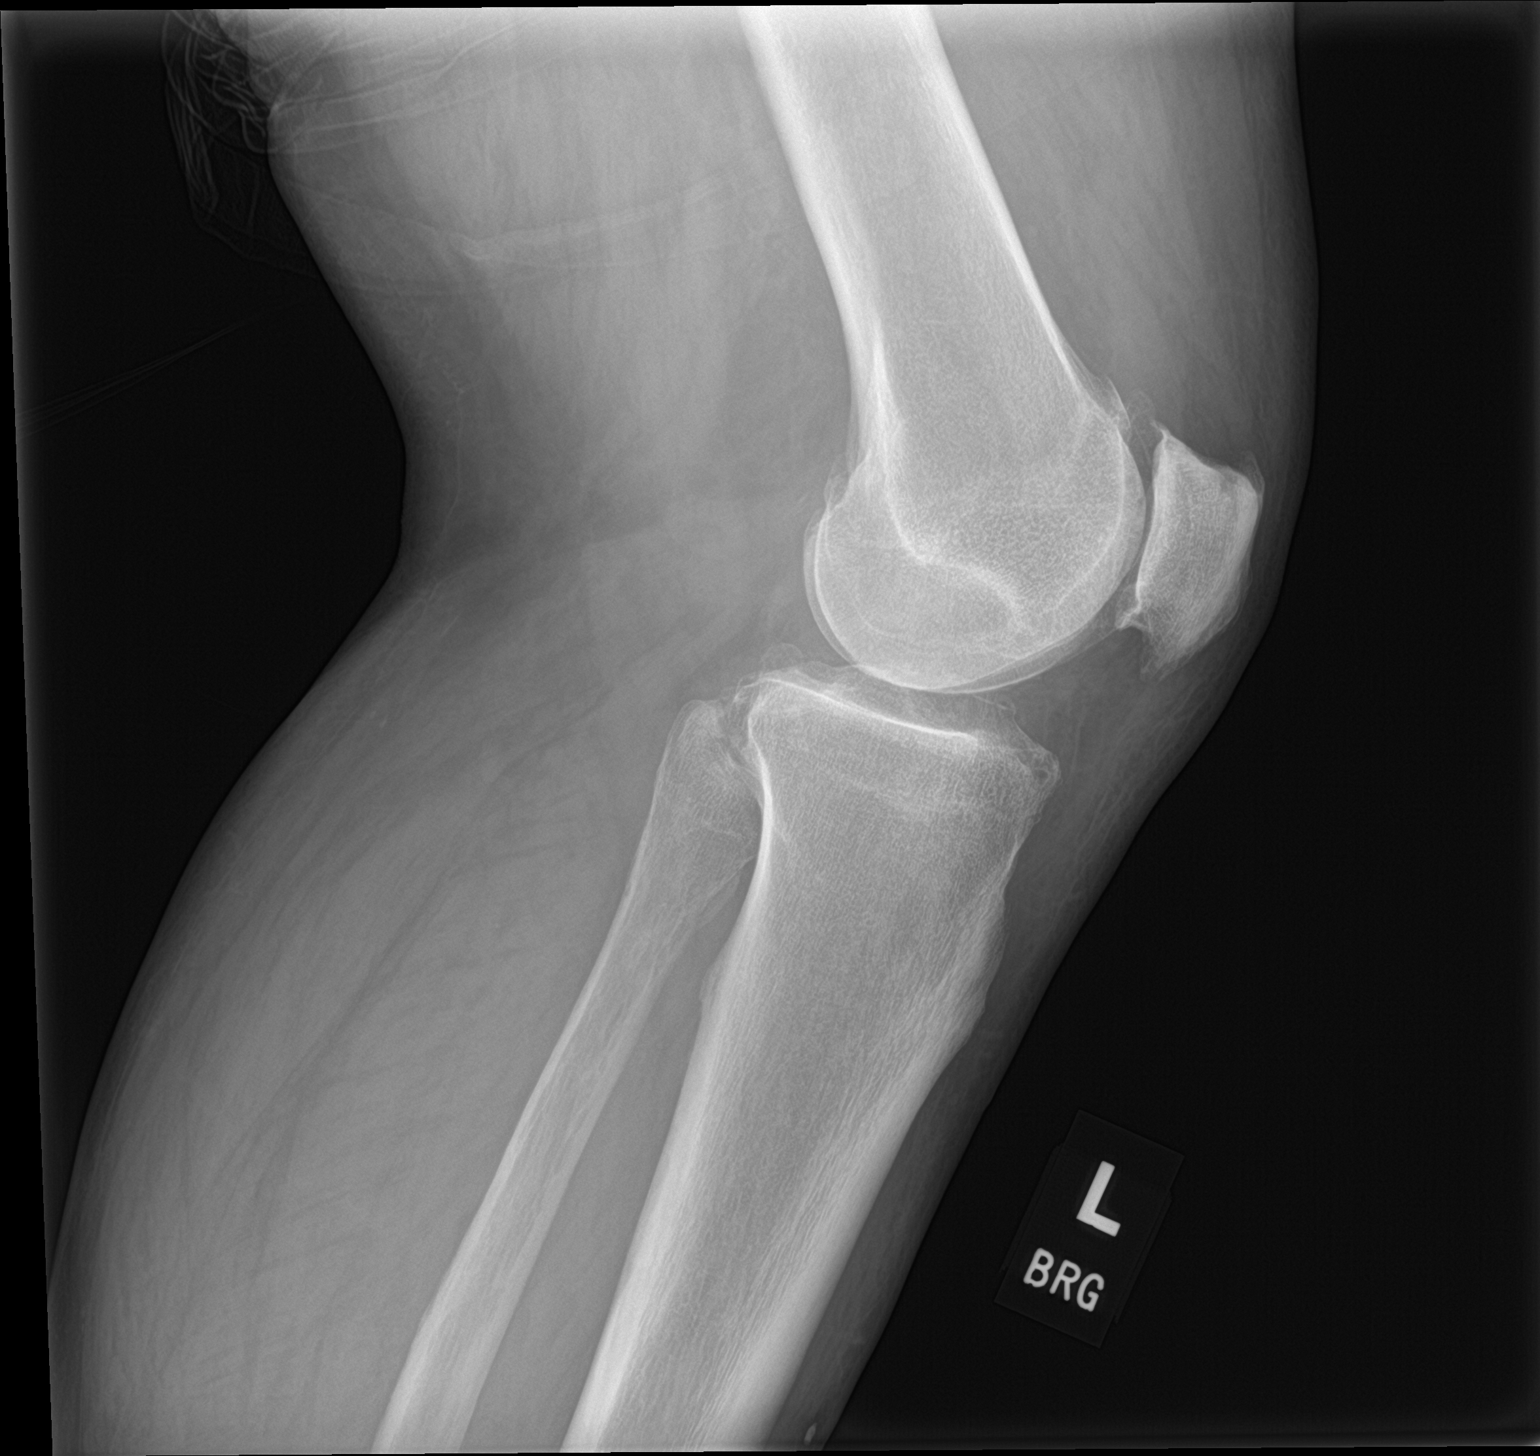

[knee sunrise]
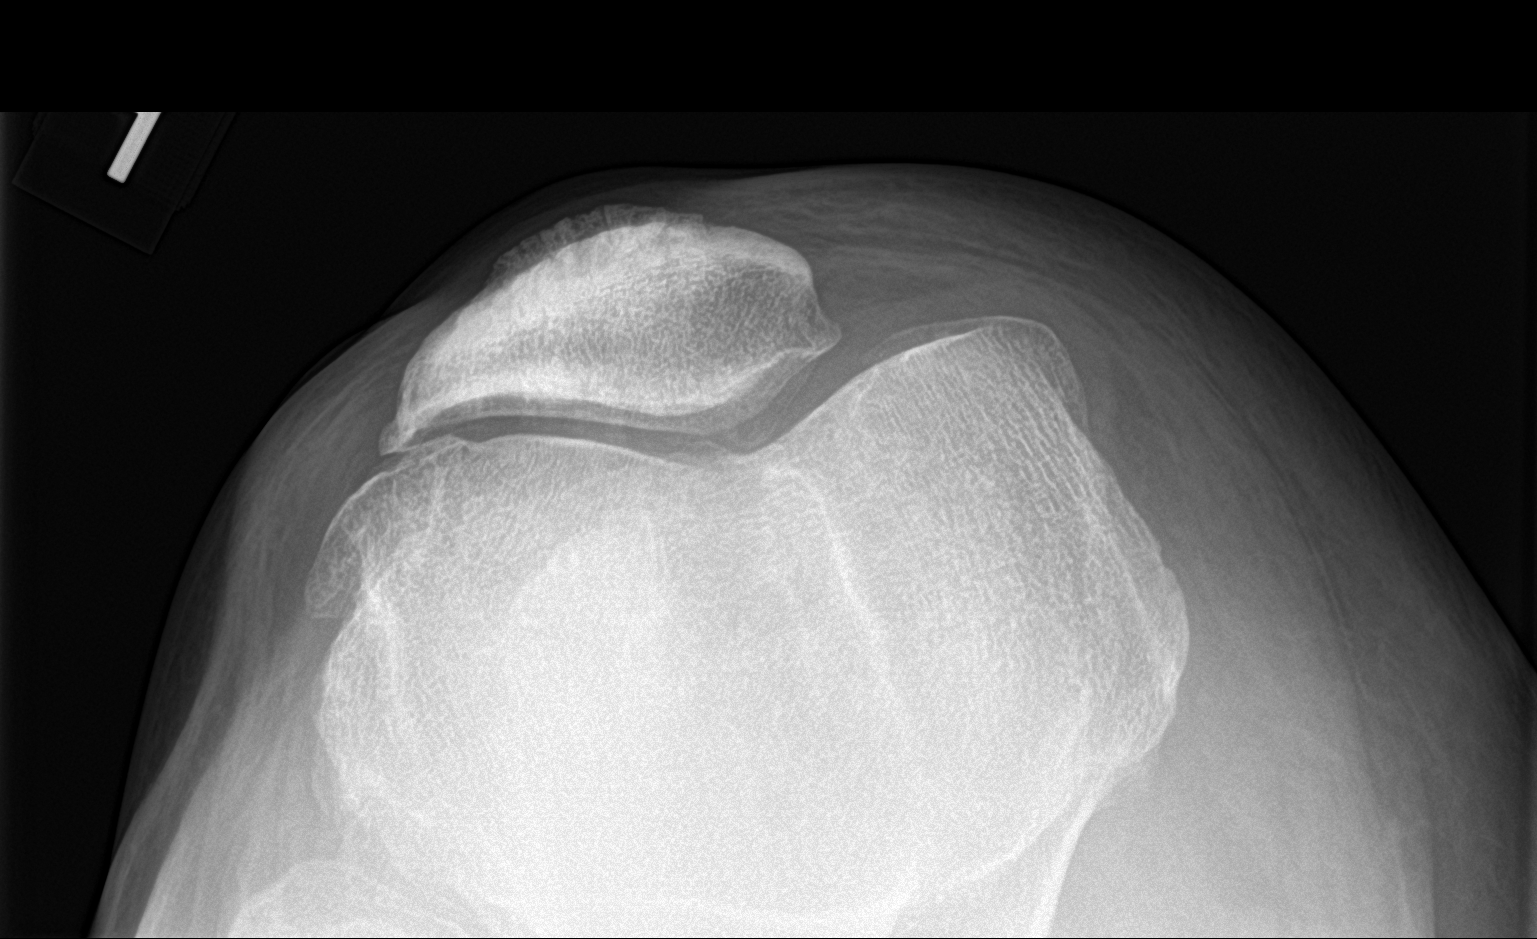

[3 of 3 positions shown; findings below may reference images not displayed]

FINDINGS: Frontal, lateral, and sunrise views of the left knee are obtained.
There is 3 compartmental osteoarthritis greatest in the lateral and
patellofemoral compartments. Trace joint effusion. No fracture,
subluxation, or dislocation. Soft tissues are unremarkable.
IMPRESSION: 1. Three compartmental osteoarthritis.
2. Trace joint effusion.
3. No acute fracture.

## 2022-06-16 DIAGNOSIS — E559 Vitamin D deficiency, unspecified: Secondary | ICD-10-CM | POA: Diagnosis not present

## 2022-06-16 DIAGNOSIS — I10 Essential (primary) hypertension: Secondary | ICD-10-CM | POA: Diagnosis not present

## 2022-06-16 DIAGNOSIS — E782 Mixed hyperlipidemia: Secondary | ICD-10-CM | POA: Diagnosis not present

## 2022-06-16 DIAGNOSIS — E785 Hyperlipidemia, unspecified: Secondary | ICD-10-CM | POA: Diagnosis not present

## 2022-06-16 DIAGNOSIS — E119 Type 2 diabetes mellitus without complications: Secondary | ICD-10-CM | POA: Diagnosis not present

## 2022-06-22 DIAGNOSIS — E785 Hyperlipidemia, unspecified: Secondary | ICD-10-CM | POA: Diagnosis not present

## 2022-06-22 DIAGNOSIS — I10 Essential (primary) hypertension: Secondary | ICD-10-CM | POA: Diagnosis not present

## 2022-06-22 DIAGNOSIS — E119 Type 2 diabetes mellitus without complications: Secondary | ICD-10-CM | POA: Diagnosis not present

## 2022-06-22 DIAGNOSIS — N1831 Chronic kidney disease, stage 3a: Secondary | ICD-10-CM | POA: Diagnosis not present

## 2022-06-22 DIAGNOSIS — E1122 Type 2 diabetes mellitus with diabetic chronic kidney disease: Secondary | ICD-10-CM | POA: Diagnosis not present

## 2022-06-22 DIAGNOSIS — E782 Mixed hyperlipidemia: Secondary | ICD-10-CM | POA: Diagnosis not present

## 2022-06-22 DIAGNOSIS — Z0001 Encounter for general adult medical examination with abnormal findings: Secondary | ICD-10-CM | POA: Diagnosis not present

## 2022-06-22 DIAGNOSIS — M25562 Pain in left knee: Secondary | ICD-10-CM | POA: Diagnosis not present

## 2022-06-29 DIAGNOSIS — H5213 Myopia, bilateral: Secondary | ICD-10-CM | POA: Diagnosis not present

## 2022-07-22 DIAGNOSIS — G5603 Carpal tunnel syndrome, bilateral upper limbs: Secondary | ICD-10-CM | POA: Diagnosis not present

## 2022-09-08 ENCOUNTER — Other Ambulatory Visit: Payer: Self-pay | Admitting: Specialist

## 2022-09-08 DIAGNOSIS — M542 Cervicalgia: Secondary | ICD-10-CM

## 2022-10-06 ENCOUNTER — Ambulatory Visit
Admission: RE | Admit: 2022-10-06 | Discharge: 2022-10-06 | Disposition: A | Discharge status: Home / Self Care | Payer: Medicare HMO | Source: Ambulatory Visit | Attending: Specialist | Admitting: Specialist

## 2022-10-06 DIAGNOSIS — M542 Cervicalgia: Secondary | ICD-10-CM

## 2022-10-06 DIAGNOSIS — M4802 Spinal stenosis, cervical region: Secondary | ICD-10-CM | POA: Diagnosis not present

## 2022-12-09 DIAGNOSIS — R9389 Abnormal findings on diagnostic imaging of other specified body structures: Secondary | ICD-10-CM | POA: Diagnosis not present

## 2022-12-09 DIAGNOSIS — G5603 Carpal tunnel syndrome, bilateral upper limbs: Secondary | ICD-10-CM | POA: Diagnosis not present

## 2022-12-09 DIAGNOSIS — M7912 Myalgia of auxiliary muscles, head and neck: Secondary | ICD-10-CM | POA: Diagnosis not present

## 2022-12-09 DIAGNOSIS — G43709 Chronic migraine without aura, not intractable, without status migrainosus: Secondary | ICD-10-CM | POA: Diagnosis not present

## 2022-12-17 DIAGNOSIS — E559 Vitamin D deficiency, unspecified: Secondary | ICD-10-CM | POA: Diagnosis not present

## 2022-12-17 DIAGNOSIS — E782 Mixed hyperlipidemia: Secondary | ICD-10-CM | POA: Diagnosis not present

## 2022-12-17 DIAGNOSIS — E119 Type 2 diabetes mellitus without complications: Secondary | ICD-10-CM | POA: Diagnosis not present

## 2022-12-17 DIAGNOSIS — I10 Essential (primary) hypertension: Secondary | ICD-10-CM | POA: Diagnosis not present

## 2022-12-17 DIAGNOSIS — E785 Hyperlipidemia, unspecified: Secondary | ICD-10-CM | POA: Diagnosis not present

## 2022-12-23 DIAGNOSIS — M25562 Pain in left knee: Secondary | ICD-10-CM | POA: Diagnosis not present

## 2022-12-23 DIAGNOSIS — E782 Mixed hyperlipidemia: Secondary | ICD-10-CM | POA: Diagnosis not present

## 2022-12-23 DIAGNOSIS — E1165 Type 2 diabetes mellitus with hyperglycemia: Secondary | ICD-10-CM | POA: Diagnosis not present

## 2022-12-23 DIAGNOSIS — N1831 Chronic kidney disease, stage 3a: Secondary | ICD-10-CM | POA: Diagnosis not present

## 2022-12-23 DIAGNOSIS — G4700 Insomnia, unspecified: Secondary | ICD-10-CM | POA: Diagnosis not present

## 2022-12-23 DIAGNOSIS — G8929 Other chronic pain: Secondary | ICD-10-CM | POA: Diagnosis not present

## 2022-12-23 DIAGNOSIS — E559 Vitamin D deficiency, unspecified: Secondary | ICD-10-CM | POA: Diagnosis not present

## 2022-12-23 DIAGNOSIS — E1122 Type 2 diabetes mellitus with diabetic chronic kidney disease: Secondary | ICD-10-CM | POA: Diagnosis not present

## 2022-12-28 DIAGNOSIS — M542 Cervicalgia: Secondary | ICD-10-CM | POA: Diagnosis not present

## 2022-12-28 DIAGNOSIS — G894 Chronic pain syndrome: Secondary | ICD-10-CM | POA: Diagnosis not present

## 2022-12-28 DIAGNOSIS — M47812 Spondylosis without myelopathy or radiculopathy, cervical region: Secondary | ICD-10-CM | POA: Diagnosis not present

## 2022-12-28 DIAGNOSIS — M5481 Occipital neuralgia: Secondary | ICD-10-CM | POA: Diagnosis not present

## 2023-01-03 DIAGNOSIS — E1165 Type 2 diabetes mellitus with hyperglycemia: Secondary | ICD-10-CM | POA: Diagnosis not present

## 2023-01-26 DIAGNOSIS — M47812 Spondylosis without myelopathy or radiculopathy, cervical region: Secondary | ICD-10-CM | POA: Diagnosis not present

## 2023-02-16 DIAGNOSIS — M47812 Spondylosis without myelopathy or radiculopathy, cervical region: Secondary | ICD-10-CM | POA: Diagnosis not present

## 2023-02-16 DIAGNOSIS — M5481 Occipital neuralgia: Secondary | ICD-10-CM | POA: Diagnosis not present

## 2023-02-16 DIAGNOSIS — M542 Cervicalgia: Secondary | ICD-10-CM | POA: Diagnosis not present

## 2023-02-16 DIAGNOSIS — G894 Chronic pain syndrome: Secondary | ICD-10-CM | POA: Diagnosis not present

## 2023-04-03 DIAGNOSIS — E1165 Type 2 diabetes mellitus with hyperglycemia: Secondary | ICD-10-CM | POA: Diagnosis not present

## 2023-06-16 DIAGNOSIS — E559 Vitamin D deficiency, unspecified: Secondary | ICD-10-CM | POA: Diagnosis not present

## 2023-06-16 DIAGNOSIS — E782 Mixed hyperlipidemia: Secondary | ICD-10-CM | POA: Diagnosis not present

## 2023-06-16 DIAGNOSIS — E1165 Type 2 diabetes mellitus with hyperglycemia: Secondary | ICD-10-CM | POA: Diagnosis not present

## 2023-06-22 DIAGNOSIS — G8929 Other chronic pain: Secondary | ICD-10-CM | POA: Diagnosis not present

## 2023-06-22 DIAGNOSIS — E1165 Type 2 diabetes mellitus with hyperglycemia: Secondary | ICD-10-CM | POA: Diagnosis not present

## 2023-06-22 DIAGNOSIS — E559 Vitamin D deficiency, unspecified: Secondary | ICD-10-CM | POA: Diagnosis not present

## 2023-06-22 DIAGNOSIS — E782 Mixed hyperlipidemia: Secondary | ICD-10-CM | POA: Diagnosis not present

## 2023-06-22 DIAGNOSIS — Z6838 Body mass index (BMI) 38.0-38.9, adult: Secondary | ICD-10-CM | POA: Diagnosis not present

## 2023-06-22 DIAGNOSIS — N1831 Chronic kidney disease, stage 3a: Secondary | ICD-10-CM | POA: Diagnosis not present

## 2023-06-22 DIAGNOSIS — M25562 Pain in left knee: Secondary | ICD-10-CM | POA: Diagnosis not present

## 2023-06-22 DIAGNOSIS — G4700 Insomnia, unspecified: Secondary | ICD-10-CM | POA: Diagnosis not present

## 2023-07-02 DIAGNOSIS — E1165 Type 2 diabetes mellitus with hyperglycemia: Secondary | ICD-10-CM | POA: Diagnosis not present

## 2023-11-18 ENCOUNTER — Encounter: Payer: Self-pay | Admitting: Radiology

## 2023-11-21 DIAGNOSIS — E782 Mixed hyperlipidemia: Secondary | ICD-10-CM | POA: Diagnosis not present

## 2023-11-21 DIAGNOSIS — E559 Vitamin D deficiency, unspecified: Secondary | ICD-10-CM | POA: Diagnosis not present

## 2023-11-21 DIAGNOSIS — E1165 Type 2 diabetes mellitus with hyperglycemia: Secondary | ICD-10-CM | POA: Diagnosis not present

## 2023-11-24 ENCOUNTER — Other Ambulatory Visit (HOSPITAL_COMMUNITY): Payer: Self-pay | Admitting: Family Medicine

## 2023-11-24 DIAGNOSIS — M25562 Pain in left knee: Secondary | ICD-10-CM | POA: Diagnosis not present

## 2023-11-24 DIAGNOSIS — M79642 Pain in left hand: Secondary | ICD-10-CM

## 2023-11-24 DIAGNOSIS — G8929 Other chronic pain: Secondary | ICD-10-CM | POA: Diagnosis not present

## 2023-11-24 DIAGNOSIS — E782 Mixed hyperlipidemia: Secondary | ICD-10-CM | POA: Diagnosis not present

## 2023-11-24 DIAGNOSIS — E559 Vitamin D deficiency, unspecified: Secondary | ICD-10-CM | POA: Diagnosis not present

## 2023-11-24 DIAGNOSIS — Z6834 Body mass index (BMI) 34.0-34.9, adult: Secondary | ICD-10-CM | POA: Diagnosis not present

## 2023-11-24 DIAGNOSIS — G4700 Insomnia, unspecified: Secondary | ICD-10-CM | POA: Diagnosis not present

## 2023-11-24 DIAGNOSIS — N1831 Chronic kidney disease, stage 3a: Secondary | ICD-10-CM | POA: Diagnosis not present

## 2023-11-24 DIAGNOSIS — E1165 Type 2 diabetes mellitus with hyperglycemia: Secondary | ICD-10-CM | POA: Diagnosis not present

## 2024-01-30 ENCOUNTER — Encounter: Payer: Self-pay | Admitting: Radiology

## 2024-04-04 ENCOUNTER — Encounter: Payer: Self-pay | Admitting: Orthopedic Surgery

## 2024-04-04 ENCOUNTER — Other Ambulatory Visit: Payer: Self-pay

## 2024-04-04 ENCOUNTER — Ambulatory Visit: Admitting: Orthopedic Surgery

## 2024-04-04 DIAGNOSIS — G8929 Other chronic pain: Secondary | ICD-10-CM

## 2024-04-04 DIAGNOSIS — M1712 Unilateral primary osteoarthritis, left knee: Secondary | ICD-10-CM | POA: Diagnosis not present

## 2024-04-04 NOTE — Progress Notes (Signed)
 Orthopaedic Postop Note  Assessment: Tyrone Peterson is a 59 y.o. male s/p left knee arthroscopy, partial medial meniscectomy  DOS: 10/08/2021  Plan: Tyrone Peterson has had progressively worsening pain in the left knee.  He did undergo a left knee arthroscopy greater than 2 years ago.  For the past 8 months, has had pain in the anterior and medial aspect of the left knee.  Repeat radiographs today demonstrates degenerative changes in the medial, as well as the patellofemoral compartment.  These are consistent with the areas of pain.  He is not interested in surgery.  I recommended a steroid injection.  This was completed in clinic today.  Procedure note injection Left knee joint   Verbal consent was obtained to inject the left knee joint  Timeout was completed to confirm the site of injection.  The skin was prepped with alcohol and ethyl chloride was sprayed at the injection site.  A 21-gauge needle was used to inject 40 mg of Depo-Medrol  and 1% lidocaine  (4 cc) into the left knee using an anterolateral approach.  There were no complications. A sterile bandage was applied.      Follow-up: Return if symptoms worsen or fail to improve. XR at next visit: None  Subjective:  Chief Complaint  Patient presents with   Knee Pain    L Approx 8 mos ago pt missed a step and landed hard on that knee. Pain wasn't immediate and has been gradually getting worse, does feel knee may buckle.  Had a meniscectomy DOS: 10/08/2021    History of Present Illness: Tyrone Peterson is a 58 y.o. male who returns to clinic for repeat evaluation of left knee pain.  He had left knee arthroscopy in July, 2023.  He has done well until recently.  Approximately 8 months ago, his dog tripped him up, and impacted the anterior aspect of the left knee.  Since then, he has had intermittent, and progressively worsening pain.  Medications have not been effective.  Review of Systems: No fevers or chills No numbness  or tingling No Chest Pain No shortness of breath   Objective: There were no vitals taken for this visit.  Physical Exam:  Alert and oriented.  No acute distress.  Left knee without deformity.  No swelling.  Range of motion from 3-120 degrees without pain.  Tenderness to palpation along the medial joint line.  Crepitus with range of motion testing.  No increased laxity varus or valgus stress.  Negative Lachman.   IMAGING: I personally ordered and reviewed the following images:  X-rays of the left knee were obtained in clinic today.  No acute injuries noted.  Mild varus alignment overall.  Near complete loss of joint space within the medial compartment.  There are osteophytes within the medial lateral compartment.  Lateral patellar tilt.  Large osteophytes noted in the patellofemoral compartment.  Impression: Left knee x-rays with moderate to severe degenerative changes  Oneil DELENA Horde, MD 04/04/2024 2:46 PM

## 2024-04-04 NOTE — Patient Instructions (Signed)
